# Patient Record
Sex: Female | Born: 1935 | Race: White | Hispanic: No | Marital: Single | Smoking: Never smoker
Health system: Southern US, Community
[De-identification: ages and names within clinical notes are randomized; demographics above are authoritative.]

## PROBLEM LIST (undated history)

## (undated) DIAGNOSIS — K219 Gastro-esophageal reflux disease without esophagitis: Secondary | ICD-10-CM

## (undated) DIAGNOSIS — T884XXA Failed or difficult intubation, initial encounter: Secondary | ICD-10-CM

## (undated) DIAGNOSIS — G4700 Insomnia, unspecified: Secondary | ICD-10-CM

## (undated) DIAGNOSIS — E785 Hyperlipidemia, unspecified: Secondary | ICD-10-CM

## (undated) DIAGNOSIS — L0211 Cutaneous abscess of neck: Secondary | ICD-10-CM

## (undated) DIAGNOSIS — F329 Major depressive disorder, single episode, unspecified: Secondary | ICD-10-CM

## (undated) DIAGNOSIS — M810 Age-related osteoporosis without current pathological fracture: Secondary | ICD-10-CM

## (undated) DIAGNOSIS — J384 Edema of larynx: Secondary | ICD-10-CM

## (undated) DIAGNOSIS — R069 Unspecified abnormalities of breathing: Secondary | ICD-10-CM

## (undated) DIAGNOSIS — Z930 Tracheostomy status: Secondary | ICD-10-CM

## (undated) DIAGNOSIS — E039 Hypothyroidism, unspecified: Secondary | ICD-10-CM

## (undated) DIAGNOSIS — F32A Depression, unspecified: Secondary | ICD-10-CM

## (undated) DIAGNOSIS — B950 Streptococcus, group A, as the cause of diseases classified elsewhere: Secondary | ICD-10-CM

## (undated) DIAGNOSIS — I10 Essential (primary) hypertension: Secondary | ICD-10-CM

## (undated) HISTORY — DX: Edema of larynx: J38.4

## (undated) HISTORY — DX: Gastro-esophageal reflux disease without esophagitis: K21.9

## (undated) HISTORY — DX: Cutaneous abscess of neck: L02.11

## (undated) HISTORY — DX: Age-related osteoporosis without current pathological fracture: M81.0

## (undated) HISTORY — DX: Streptococcus, group A, as the cause of diseases classified elsewhere: B95.0

## (undated) HISTORY — DX: Unspecified abnormalities of breathing: R06.9

## (undated) HISTORY — DX: Hyperlipidemia, unspecified: E78.5

## (undated) HISTORY — DX: Tracheostomy status: Z93.0

---

## 1998-05-06 ENCOUNTER — Ambulatory Visit (HOSPITAL_COMMUNITY): Admission: RE | Admit: 1998-05-06 | Discharge: 1998-05-06 | Payer: Self-pay | Admitting: Internal Medicine

## 1998-05-06 ENCOUNTER — Encounter: Payer: Self-pay | Admitting: Internal Medicine

## 1999-08-29 ENCOUNTER — Other Ambulatory Visit: Admission: RE | Admit: 1999-08-29 | Discharge: 1999-08-29 | Payer: Self-pay | Admitting: Emergency Medicine

## 1999-09-07 ENCOUNTER — Encounter: Payer: Self-pay | Admitting: Family Medicine

## 1999-09-07 ENCOUNTER — Ambulatory Visit (HOSPITAL_COMMUNITY): Admission: RE | Admit: 1999-09-07 | Discharge: 1999-09-07 | Payer: Self-pay | Admitting: Family Medicine

## 1999-09-26 ENCOUNTER — Encounter: Payer: Self-pay | Admitting: Internal Medicine

## 1999-10-09 ENCOUNTER — Encounter (INDEPENDENT_AMBULATORY_CARE_PROVIDER_SITE_OTHER): Payer: Self-pay | Admitting: *Deleted

## 1999-10-09 ENCOUNTER — Encounter (INDEPENDENT_AMBULATORY_CARE_PROVIDER_SITE_OTHER): Payer: Self-pay | Admitting: Specialist

## 1999-10-09 ENCOUNTER — Ambulatory Visit (HOSPITAL_COMMUNITY): Admission: RE | Admit: 1999-10-09 | Discharge: 1999-10-09 | Payer: Self-pay | Admitting: Gastroenterology

## 2000-09-11 ENCOUNTER — Encounter: Payer: Self-pay | Admitting: Family Medicine

## 2000-09-11 ENCOUNTER — Ambulatory Visit (HOSPITAL_COMMUNITY): Admission: RE | Admit: 2000-09-11 | Discharge: 2000-09-11 | Payer: Self-pay | Admitting: Family Medicine

## 2001-10-03 ENCOUNTER — Ambulatory Visit (HOSPITAL_COMMUNITY): Admission: RE | Admit: 2001-10-03 | Discharge: 2001-10-03 | Payer: Self-pay | Admitting: *Deleted

## 2001-12-10 ENCOUNTER — Other Ambulatory Visit: Admission: RE | Admit: 2001-12-10 | Discharge: 2001-12-10 | Payer: Self-pay | Admitting: Family Medicine

## 2003-01-06 ENCOUNTER — Ambulatory Visit (HOSPITAL_COMMUNITY): Admission: RE | Admit: 2003-01-06 | Discharge: 2003-01-06 | Payer: Self-pay | Admitting: Family Medicine

## 2003-01-12 ENCOUNTER — Encounter: Admission: RE | Admit: 2003-01-12 | Discharge: 2003-01-12 | Payer: Self-pay | Admitting: Family Medicine

## 2003-02-17 ENCOUNTER — Other Ambulatory Visit: Admission: RE | Admit: 2003-02-17 | Discharge: 2003-02-17 | Payer: Self-pay | Admitting: Family Medicine

## 2003-05-18 ENCOUNTER — Encounter: Admission: RE | Admit: 2003-05-18 | Discharge: 2003-08-16 | Payer: Self-pay | Admitting: Family Medicine

## 2004-02-18 ENCOUNTER — Other Ambulatory Visit: Admission: RE | Admit: 2004-02-18 | Discharge: 2004-02-18 | Payer: Self-pay | Admitting: Family Medicine

## 2004-04-07 ENCOUNTER — Ambulatory Visit: Payer: Self-pay | Admitting: Internal Medicine

## 2004-05-18 ENCOUNTER — Ambulatory Visit: Payer: Self-pay | Admitting: Internal Medicine

## 2005-09-28 ENCOUNTER — Other Ambulatory Visit: Admission: RE | Admit: 2005-09-28 | Discharge: 2005-09-28 | Payer: Self-pay | Admitting: Obstetrics and Gynecology

## 2005-12-04 ENCOUNTER — Ambulatory Visit (HOSPITAL_COMMUNITY): Admission: RE | Admit: 2005-12-04 | Discharge: 2005-12-04 | Payer: Self-pay | Admitting: Family Medicine

## 2007-01-08 ENCOUNTER — Ambulatory Visit (HOSPITAL_COMMUNITY): Admission: RE | Admit: 2007-01-08 | Discharge: 2007-01-08 | Payer: Self-pay | Admitting: Family Medicine

## 2007-02-06 ENCOUNTER — Other Ambulatory Visit: Admission: RE | Admit: 2007-02-06 | Discharge: 2007-02-06 | Payer: Self-pay | Admitting: Obstetrics and Gynecology

## 2008-01-22 ENCOUNTER — Ambulatory Visit (HOSPITAL_COMMUNITY): Admission: RE | Admit: 2008-01-22 | Discharge: 2008-01-22 | Payer: Self-pay | Admitting: Family Medicine

## 2009-03-08 ENCOUNTER — Other Ambulatory Visit: Admission: RE | Admit: 2009-03-08 | Discharge: 2009-03-08 | Payer: Self-pay | Admitting: Family Medicine

## 2009-03-17 ENCOUNTER — Ambulatory Visit (HOSPITAL_COMMUNITY): Admission: RE | Admit: 2009-03-17 | Discharge: 2009-03-17 | Payer: Self-pay | Admitting: Family Medicine

## 2009-09-09 ENCOUNTER — Encounter (INDEPENDENT_AMBULATORY_CARE_PROVIDER_SITE_OTHER): Payer: Self-pay | Admitting: *Deleted

## 2009-10-08 ENCOUNTER — Encounter: Payer: Self-pay | Admitting: Internal Medicine

## 2009-10-17 ENCOUNTER — Encounter (INDEPENDENT_AMBULATORY_CARE_PROVIDER_SITE_OTHER): Payer: Self-pay

## 2009-10-18 ENCOUNTER — Ambulatory Visit: Payer: Self-pay | Admitting: Internal Medicine

## 2009-10-27 ENCOUNTER — Ambulatory Visit: Payer: Self-pay | Admitting: Internal Medicine

## 2009-10-29 ENCOUNTER — Encounter: Payer: Self-pay | Admitting: Internal Medicine

## 2010-01-28 ENCOUNTER — Encounter: Payer: Self-pay | Admitting: Family Medicine

## 2010-01-29 ENCOUNTER — Encounter: Payer: Self-pay | Admitting: Family Medicine

## 2010-02-09 NOTE — Procedures (Signed)
Summary: Colonoscopy  Patient: Gabrielle Whitney Note: All result statuses are Final unless otherwise noted.  Tests: (1) Colonoscopy (COL)   COL Colonoscopy           DONE     [facility]     [address]     [place], [place]  [address]           COLONOSCOPY PROCEDURE REPORT           PATIENT:  [name], [name]  MR#:  [id number]     BIRTHDATE:  01-23-1933, 76 yrs. old  GENDER:  female     ENDOSCOPIST:  Hedwig Morton. Juanda Chance, MD     REF. BY:  Carolin Coy, M.D.     PROCEDURE DATE:  10/27/2009     PROCEDURE:  Colonoscopy 19147     ASA CLASS:  Class II     INDICATIONS:  Routine Risk Screening     MEDICATIONS:   Versed 10 mg, Fentanyl 100 mcg           DESCRIPTION OF PROCEDURE:   After the risks benefits and     alternatives of the procedure were thoroughly explained, informed     consent was obtained.  Digital rectal exam was performed and     revealed no rectal masses.   The LB PCF-H180AL [id number] endoscope     was introduced through the anus and advanced to the cecum, which     was identified by both the appendix and ileocecal valve, without     limitations.  The quality of the prep was good, using MiraLax.     The instrument was then slowly withdrawn as the colon was fully     examined.     <<PROCEDUREIMAGES>>           FINDINGS:  A sessile polyp was found. 5 mm polyp at 20cm, it was     located at the diverticulum Polyp was snared without cautery.     Retrieval was successful (see image7). snare polyp  Moderate     diverticulosis was found in the sigmoid colon (see image6, image1,     and image2).  This was otherwise a normal examination of the colon     (see image3, image4, image5, and image8).   Retroflexed views in     the rectum revealed no abnormalities.    The scope was then     withdrawn from the patient and the procedure completed.           COMPLICATIONS:  None     ENDOSCOPIC IMPRESSION:     1) Sessile polyp     2) Moderate diverticulosis in the sigmoid colon  3) Otherwise normal examination     RECOMMENDATIONS:     1) Await pathology results     2) High fiber diet.     REPEAT EXAM:  In 5 - 7 year(s) for.  recall will be determined     based on polyp pathology           ______________________________     Hedwig Morton. Juanda Chance, MD           CC:           n.     eSIGNED:   Hedwig Morton. Brodie at 10/27/2009 11:48 AM           Jeananne Rama, [phone]  Note: An exclamation mark (!) indicates a result that was not dispersed into the flowsheet. Document Creation Date: 10/27/2009  11:49 AM _______________________________________________________________________  (1) Order result status: Final Collection or observation date-time: 10/27/2009 11:37 Requested date-time:  Receipt date-time:  Reported date-time:  Referring Physician:   Ordering Physician: Lina Sar [id number]) Specimen Source:  Source: Launa Grill Order Number: [id number] Lab site:   Appended Document: Colonoscopy     Procedures Next Due Date:    Colonoscopy: 10/2016

## 2010-02-09 NOTE — Miscellaneous (Signed)
Summary: Lec previsit  Clinical Lists Changes  Medications: Added new medication of MIRALAX   POWD (POLYETHYLENE GLYCOL 3350) As per prep  instructions. - Signed Added new medication of REGLAN 10 MG  TABS (METOCLOPRAMIDE HCL) As per prep instructions. - Signed Added new medication of DULCOLAX 5 MG  TBEC (BISACODYL) Day before procedure take 2 at 3pm and 2 at 8pm. - Signed Rx of MIRALAX   POWD (POLYETHYLENE GLYCOL 3350) As per prep  instructions.;  #255gm x 0;  Signed;  Entered by: Ulis Rias RN;  Authorized by: Hart Carwin MD;  Method used: Electronically to [facility] [phone]*, [address] [place], [place], [place]  [address], Ph: [phone] or [phone], Fax: [phone] Rx of REGLAN 10 MG  TABS (METOCLOPRAMIDE HCL) As per prep instructions.;  #2 x 0;  Signed;  Entered by: Ulis Rias RN;  Authorized by: Hart Carwin MD;  Method used: Electronically to [facility] [phone]*, [address] [place], [place], [place]  [address], Ph: [phone] or [phone], Fax: [phone] Rx of DULCOLAX 5 MG  TBEC (BISACODYL) Day before procedure take 2 at 3pm and 2 at 8pm.;  #4 x 0;  Signed;  Entered by: Ulis Rias RN;  Authorized by: Hart Carwin MD;  Method used: Electronically to [facility] [phone]*, [address] [place], [place], [place]  [address], Ph: [phone] or [phone], Fax: [phone] Observations: Added new observation of NKA: T (10/18/2009 7:55)    Prescriptions: DULCOLAX 5 MG  TBEC (BISACODYL) Day before procedure take 2 at 3pm and 2 at 8pm.  #4 x 0   Entered by:   Ulis Rias RN   Authorized by:   Hart Carwin MD   Signed by:   Ulis Rias RN on 10/18/2009   Method used:   Electronically to        [facility] [phone]* (retail)       [address]       [place], [place]  [address]       Ph: [phone] or [phone]       Fax: [phone]   RxID:   [id number] REGLAN 10 MG  TABS (METOCLOPRAMIDE  HCL) As per prep instructions.  #2 x 0   Entered by:   Ulis Rias RN   Authorized by:   Hart Carwin MD   Signed by:   Ulis Rias RN on 10/18/2009   Method used:   Electronically to        [facility] [phone]* (retail)       [address]       [place], [place]  [address]       Ph: [phone] or [phone]       Fax: [phone]   RxID:   [id number] MIRALAX   POWD (POLYETHYLENE GLYCOL 3350) As per prep  instructions.  #255gm x 0   Entered by:   Ulis Rias RN   Authorized by:   Hart Carwin MD   Signed by:   Ulis Rias RN on 10/18/2009   Method used:   Electronically to        [facility] [phone]* (retail)       [address]       [place], [place]  [address]       Ph: [phone] or [phone]  Fax: [phone]   RxID:   [id number]

## 2010-02-09 NOTE — Op Note (Signed)
Summary: COLON (Dr Madilyn Fireman)                         [facility]  Patient:    Gabrielle Whitney, Gabrielle Whitney                        MRN: [id number] Proc. Date: 10/09/99 Adm. Date:  [id number] Attending:  Louie Bun CC:         Carola J. Gerri Spore, M.D.   Procedure Report  PROCEDURE PERFORMED:  Colonoscopy.  ENDOSCOPIST:  Everardo All. Madilyn Fireman, M.D.  INDICATIONS:  Change in stool caliber in a 77 year old patient with no prior colon screening.  She has had a few attempted sigmoidoscopies which apparently were of limited visualization due to sharp angulation at the rectosigmoid junction.  DESCRIPTION OF PROCEDURE:  The patient was placed in the left lateral decubitus position and placed on the pulse monitor with continuous low flow oxygen delivered by nasal cannula.  She was sedated with 70 mg IV Demerol and 7 mg IV Versed.  The Olympus video colonoscope was inserted into the rectum and advanced to the cecum, confirmed by transillumination at McBurneys point and visualization at the ileocecal valve and appendiceal orifice.  The prep was excellent.  The terminal ileum was intubated and explored for several cm and appeared to be within normal limits.  There were some streaks of erythema and friability, not circumferential in approximately 5-6 cm length of the ascending colon.  This appeared more consistent with scope trauma than a true colitis, but was visualized before the scope had entered that area.  Biopsies were taken.  The remainder of the ascending, transverse, descending, sigmoid and rectum all appeared normal with no further inflammatory changes, masses, polyps, diverticula or other mucosal abnormalities.  The scope was then withdrawn and the patient returned to the recovery room in stable condition. She tolerated the procedure well and there were no immediate complications.  IMPRESSION: Questionable right-sided colitis versus scope trauma versus artifact from bowel prep.  PLAN:   Await biopsy results, otherwise will treat with high fiber diet with possible fiber supplementation. DD:  10/09/99 TD:  10/09/99 Job: [id number]/[id number]    FINAL DIAGNOSIS    ***MICROSCOPIC EXAMINATION AND DIAGNOSIS***    COLON, ASCENDING, BIOPSY: NO ACTIVE COLITIS IDENTIFIED. FOCI   WITH PIGMENTED MACROPHAGES IN LAMINA PROPRIA CONSISTENT WITH   MELANOSIS COLI.    kv   Date Reported: 10/10/99 Lyn Hollingshead. Delila Spence, MD   *** Electronically Signed Out By EAA ***    Clinical information   Colitis vs. scope trauma. (tmc)    specimen(s) obtained   Colon, biopsy, ascending    Gross Description   Received in formalin are tan, soft tissue fragments that are   submitted in toto. Number: 2   Size: each 0.2 cm (GRP:smr 10/1)    smr/

## 2010-02-09 NOTE — Letter (Signed)
Summary: [facility] Instructions  [facility]  [address] [place], [place] [address]   Phone: [phone]  Fax: [phone]       Gabrielle Whitney    03/05/1933    MRN: [id number]       Procedure Day /Date: Thursday 10-27-09     Arrival Time:  9:30 a.m.     Procedure Time: 10:30 a.m.     Location of Procedure:                      [facility] (4th Floor)   PREPARATION FOR COLONOSCOPY WITH MIRALAX  Starting 5 days prior to your procedure  10-22-09 do not eat nuts, seeds, popcorn, corn, beans, peas,  salads, or any raw vegetables.  Do not take any fiber supplements (e.g. Metamucil, Citrucel, and Benefiber). ____________________________________________________________________________________________________   THE DAY BEFORE YOUR PROCEDURE         DATE: 10-26-09  DAY: Wednesday  1   Drink clear liquids the entire day-NO SOLID FOOD  2   Do not drink anything colored red or purple.  Avoid juices with pulp.  No orange juice.  3   Drink at least 64 oz. (8 glasses) of fluid/clear liquids during the day to prevent dehydration and help the prep work efficiently.  CLEAR LIQUIDS INCLUDE: Water Jello Ice Popsicles Tea (sugar ok, no milk/cream) Powdered fruit flavored drinks Coffee (sugar ok, no milk/cream) Gatorade Juice: apple, white grape, white cranberry  Lemonade Clear bullion, consomm, broth Carbonated beverages (any kind) Strained chicken noodle soup Hard Candy  4   Mix the entire bottle of Miralax with 64 oz. of Gatorade/Powerade in the morning and put in the refrigerator to chill.  5   At 3:00 pm take 2 Dulcolax/Bisacodyl tablets.  6   At 4:30 pm take one Reglan/Metoclopramide tablet.  7  Starting at 5:00 pm drink one 8 oz glass of the Miralax mixture every 15-20 minutes until you have finished drinking the entire 64 oz.  You should finish drinking prep around 7:30 or 8:00 pm.  8   If you are nauseated, you may take the 2nd  Reglan/Metoclopramide tablet at 6:30 pm.        9    At 8:00 pm take 2 more DULCOLAX/Bisacodyl tablets.     THE DAY OF YOUR PROCEDURE      DATE:   10-27-09  DAY: Thursday  You may drink clear liquids until  8:30 a.m.(HOURS BEFORE PROCEDURE).   MEDICATION INSTRUCTIONS  Unless otherwise instructed, you should take regular prescription medications with a small sip of water as early as possible the morning of your procedure.  Additional medication instructions: Do not take HCTZ am of procedure.         OTHER INSTRUCTIONS  You will need a responsible adult at least 75 years of age to accompany you and drive you home.   This person must remain in the waiting room during your procedure.  Wear loose fitting clothing that is easily removed.  Leave jewelry and other valuables at home.  However, you may wish to bring a book to read or an iPod/MP3 player to listen to music as you wait for your procedure to start.  Remove all body piercing jewelry and leave at home.  Total time from sign-in until discharge is approximately 2-3 hours.  You should go home directly after your procedure and rest.  You can resume normal activities the day after your procedure.  The day  of your procedure you should not:   Drive   Make legal decisions   Operate machinery   Drink alcohol   Return to work  You will receive specific instructions about eating, activities and medications before you leave.   The above instructions have been reviewed and explained to me by   Ulis Rias RN  October 18, 2009 8:16 AM     I fully understand and can verbalize these instructions _____________________________ Date _______

## 2010-02-09 NOTE — Letter (Signed)
Summary: [facility]  [facility]   Imported By: Sherian Rein 10/21/2009 11:10:58  _____________________________________________________________________  External Attachment:    Type:   Image     Comment:   External Document

## 2010-02-09 NOTE — Letter (Signed)
Summary: Pre Visit Letter Revised  [facility]  [address] [place], [place] [address]   Phone: [phone]  Fax: [phone]        09/09/2009 MRN: [id number] [facility] [address] [place], [place]  [address]             Procedure Date:  10-27-09   Welcome to the Gastroenterology Division at [facility].    You are scheduled to see a nurse for your pre-procedure visit on 10-13-09 at 9:30a.m. on the 3rd floor at [facility], [address]  We ask that you try to arrive at our office 15 minutes prior to your appointment time to allow for check-in.  Please take a minute to review the attached form.  If you answer "Yes" to one or more of the questions on the first page, we ask that you call the person listed at your earliest opportunity.  If you answer "No" to all of the questions, please complete the rest of the form and bring it to your appointment.    Your nurse visit will consist of discussing your medical and surgical history, your immediate family medical history, and your medications.   If you are unable to list all of your medications on the form, please bring the medication bottles to your appointment and we will list them.  We will need to be aware of both prescribed and over the counter drugs.  We will need to know exact dosage information as well.    Please be prepared to read and sign documents such as consent forms, a financial agreement, and acknowledgement forms.  If necessary, and with your consent, a friend or relative is welcome to sit-in on the nurse visit with you.  Please bring your insurance card so that we may make a copy of it.  If your insurance requires a referral to see a specialist, please bring your referral form from your primary care physician.  No co-pay is required for this nurse visit.     If you cannot keep your appointment, please call [phone] to cancel or reschedule prior to your appointment date.  This allows  [place] the opportunity to schedule an appointment for another patient in need of care.    Thank you for choosing [facility] for your medical needs.  We appreciate the opportunity to care for you.  Please visit [place] at our website  to learn more about our practice.  Sincerely, The Gastroenterology Division

## 2010-02-09 NOTE — Letter (Signed)
Summary: Patient Notice- Polyp Results  [facility]  [address] [place], [place] [address]   Phone: [phone]  Fax: [phone]        October 29, 2009 MRN: [id number]    [facility] [address] [place], [place]  [address]    Dear Ms. Redondo,  I am pleased to inform you that the colon polyp(s) removed during your recent colonoscopy was (were) found to be benign (no cancer detected) upon pathologic examination.The polyp consisted of a normal tissue ( not precancerous)  I recommend you have a repeat colonoscopy examination in 7_ years to look for recurrent polyps, as having colon polyps increases your risk for having recurrent polyps or even colon cancer in the future.  Should you develop new or worsening symptoms of abdominal pain, bowel habit changes or bleeding from the rectum or bowels, please schedule an evaluation with either your primary care physician or with me.  Additional information/recommendations:  _x_ No further action with gastroenterology is needed at this time. Please      follow-up with your primary care physician for your other healthcare      needs.  __ Please call [phone] to schedule a return visit to review your      situation.  __ Please keep your follow-up visit as already scheduled.  __ Continue treatment plan as outlined the day of your exam.  Please call us if you are having persistent problems or have questions about your condition that have not been fully answered at this time.  Sincerely,  Hart Carwin MD  This letter has been electronically signed by your physician.  Appended Document: Patient Notice- Polyp Results letter mailed

## 2010-05-26 NOTE — Procedures (Signed)
[facility]  Patient:    Gabrielle Whitney, Gabrielle Whitney                        MRN: [id number] Proc. Date: 10/09/99 Adm. Date:  [id number] Attending:  Louie Bun CC:         Carola J. Gerri Spore, M.D.   Procedure Report  PROCEDURE PERFORMED:  Colonoscopy.  ENDOSCOPIST:  Everardo All. Madilyn Fireman, M.D.  INDICATIONS:  Change in stool caliber in a 77 year old patient with no prior colon screening.  She has had a few attempted sigmoidoscopies which apparently were of limited visualization due to sharp angulation at the rectosigmoid junction.  DESCRIPTION OF PROCEDURE:  The patient was placed in the left lateral decubitus position and placed on the pulse monitor with continuous low flow oxygen delivered by nasal cannula.  She was sedated with 70 mg IV Demerol and 7 mg IV Versed.  The Olympus video colonoscope was inserted into the rectum and advanced to the cecum, confirmed by transillumination at McBurneys point and visualization at the ileocecal valve and appendiceal orifice.  The prep was excellent.  The terminal ileum was intubated and explored for several cm and appeared to be within normal limits.  There were some streaks of erythema and friability, not circumferential in approximately 5-6 cm length of the ascending colon.  This appeared more consistent with scope trauma than a true colitis, but was visualized before the scope had entered that area.  Biopsies were taken.  The remainder of the ascending, transverse, descending, sigmoid and rectum all appeared normal with no further inflammatory changes, masses, polyps, diverticula or other mucosal abnormalities.  The scope was then withdrawn and the patient returned to the recovery room in stable condition. She tolerated the procedure well and there were no immediate complications.  IMPRESSION: Questionable right-sided colitis versus scope trauma versus artifact from bowel prep.  PLAN:  Await biopsy results, otherwise  will treat with high fiber diet with possible fiber supplementation. DD:  10/09/99 TD:  10/09/99 Job: [id number]

## 2011-06-27 ENCOUNTER — Other Ambulatory Visit (HOSPITAL_COMMUNITY)
Admission: RE | Admit: 2011-06-27 | Discharge: 2011-06-27 | Disposition: A | Discharge status: Home / Self Care | Payer: Medicare Other | Source: Ambulatory Visit | Attending: Family Medicine | Admitting: Family Medicine

## 2011-06-27 ENCOUNTER — Other Ambulatory Visit: Payer: Self-pay | Admitting: Family Medicine

## 2011-06-27 DIAGNOSIS — Z124 Encounter for screening for malignant neoplasm of cervix: Secondary | ICD-10-CM | POA: Insufficient documentation

## 2011-06-28 ENCOUNTER — Other Ambulatory Visit (HOSPITAL_COMMUNITY): Payer: Self-pay | Admitting: Family Medicine

## 2011-06-28 DIAGNOSIS — Z1231 Encounter for screening mammogram for malignant neoplasm of breast: Secondary | ICD-10-CM

## 2011-07-25 ENCOUNTER — Ambulatory Visit (HOSPITAL_COMMUNITY)
Admission: RE | Admit: 2011-07-25 | Discharge: 2011-07-25 | Disposition: A | Discharge status: Home / Self Care | Payer: Medicare Other | Source: Ambulatory Visit | Attending: Family Medicine | Admitting: Family Medicine

## 2011-07-25 DIAGNOSIS — Z1231 Encounter for screening mammogram for malignant neoplasm of breast: Secondary | ICD-10-CM | POA: Insufficient documentation

## 2012-08-22 ENCOUNTER — Other Ambulatory Visit: Payer: Self-pay | Admitting: Family Medicine

## 2012-08-22 ENCOUNTER — Ambulatory Visit
Admission: RE | Admit: 2012-08-22 | Discharge: 2012-08-22 | Disposition: A | Discharge status: Home / Self Care | Payer: Medicare Other | Source: Ambulatory Visit | Attending: Family Medicine | Admitting: Family Medicine

## 2012-08-22 DIAGNOSIS — M25511 Pain in right shoulder: Secondary | ICD-10-CM

## 2012-11-06 ENCOUNTER — Other Ambulatory Visit: Payer: Self-pay | Admitting: Family Medicine

## 2012-11-06 DIAGNOSIS — Z1231 Encounter for screening mammogram for malignant neoplasm of breast: Secondary | ICD-10-CM

## 2012-12-10 ENCOUNTER — Ambulatory Visit
Admission: RE | Admit: 2012-12-10 | Discharge: 2012-12-10 | Disposition: A | Discharge status: Home / Self Care | Payer: Medicare Other | Source: Ambulatory Visit | Attending: Family Medicine | Admitting: Family Medicine

## 2012-12-10 DIAGNOSIS — Z1231 Encounter for screening mammogram for malignant neoplasm of breast: Secondary | ICD-10-CM

## 2013-10-05 ENCOUNTER — Other Ambulatory Visit: Payer: Self-pay | Admitting: Physician Assistant

## 2013-10-05 ENCOUNTER — Other Ambulatory Visit (HOSPITAL_COMMUNITY)
Admission: RE | Admit: 2013-10-05 | Discharge: 2013-10-05 | Disposition: A | Discharge status: Home / Self Care | Payer: Medicare Other | Source: Ambulatory Visit | Attending: Family Medicine | Admitting: Family Medicine

## 2013-10-05 DIAGNOSIS — Z8541 Personal history of malignant neoplasm of cervix uteri: Secondary | ICD-10-CM | POA: Diagnosis present

## 2013-10-07 LAB — CYTOLOGY - PAP

## 2014-02-08 DIAGNOSIS — G4700 Insomnia, unspecified: Secondary | ICD-10-CM | POA: Diagnosis not present

## 2014-03-04 DIAGNOSIS — R0683 Snoring: Secondary | ICD-10-CM | POA: Diagnosis not present

## 2014-03-04 DIAGNOSIS — G4700 Insomnia, unspecified: Secondary | ICD-10-CM | POA: Diagnosis not present

## 2014-03-18 DIAGNOSIS — R0982 Postnasal drip: Secondary | ICD-10-CM | POA: Diagnosis not present

## 2014-03-18 DIAGNOSIS — J309 Allergic rhinitis, unspecified: Secondary | ICD-10-CM | POA: Diagnosis not present

## 2014-03-20 ENCOUNTER — Inpatient Hospital Stay (HOSPITAL_COMMUNITY): Payer: Commercial Managed Care - HMO | Admitting: Anesthesiology

## 2014-03-20 ENCOUNTER — Inpatient Hospital Stay (HOSPITAL_COMMUNITY)
Admission: EM | Admit: 2014-03-20 | Discharge: 2014-03-25 | DRG: 011 | Disposition: A | Discharge status: Home / Self Care | Payer: Commercial Managed Care - HMO | Attending: Internal Medicine | Admitting: Internal Medicine

## 2014-03-20 ENCOUNTER — Emergency Department (HOSPITAL_COMMUNITY): Payer: Commercial Managed Care - HMO

## 2014-03-20 ENCOUNTER — Encounter (HOSPITAL_COMMUNITY)
Admission: EM | Disposition: A | Discharge status: Home / Self Care | Payer: Commercial Managed Care - HMO | Source: Home / Self Care | Attending: Pulmonary Disease

## 2014-03-20 DIAGNOSIS — R221 Localized swelling, mass and lump, neck: Secondary | ICD-10-CM | POA: Diagnosis not present

## 2014-03-20 DIAGNOSIS — L0291 Cutaneous abscess, unspecified: Secondary | ICD-10-CM

## 2014-03-20 DIAGNOSIS — J384 Edema of larynx: Secondary | ICD-10-CM | POA: Diagnosis present

## 2014-03-20 DIAGNOSIS — Z930 Tracheostomy status: Secondary | ICD-10-CM

## 2014-03-20 DIAGNOSIS — E871 Hypo-osmolality and hyponatremia: Secondary | ICD-10-CM | POA: Diagnosis present

## 2014-03-20 DIAGNOSIS — R0989 Other specified symptoms and signs involving the circulatory and respiratory systems: Secondary | ICD-10-CM | POA: Diagnosis present

## 2014-03-20 DIAGNOSIS — B950 Streptococcus, group A, as the cause of diseases classified elsewhere: Secondary | ICD-10-CM | POA: Diagnosis present

## 2014-03-20 DIAGNOSIS — J989 Respiratory disorder, unspecified: Secondary | ICD-10-CM | POA: Diagnosis not present

## 2014-03-20 DIAGNOSIS — E039 Hypothyroidism, unspecified: Secondary | ICD-10-CM | POA: Diagnosis not present

## 2014-03-20 DIAGNOSIS — L0211 Cutaneous abscess of neck: Principal | ICD-10-CM | POA: Diagnosis present

## 2014-03-20 DIAGNOSIS — E876 Hypokalemia: Secondary | ICD-10-CM | POA: Diagnosis not present

## 2014-03-20 DIAGNOSIS — F329 Major depressive disorder, single episode, unspecified: Secondary | ICD-10-CM | POA: Diagnosis present

## 2014-03-20 DIAGNOSIS — Z79899 Other long term (current) drug therapy: Secondary | ICD-10-CM | POA: Diagnosis not present

## 2014-03-20 DIAGNOSIS — Z888 Allergy status to other drugs, medicaments and biological substances status: Secondary | ICD-10-CM | POA: Diagnosis not present

## 2014-03-20 DIAGNOSIS — J9600 Acute respiratory failure, unspecified whether with hypoxia or hypercapnia: Secondary | ICD-10-CM | POA: Diagnosis not present

## 2014-03-20 DIAGNOSIS — F32A Depression, unspecified: Secondary | ICD-10-CM | POA: Diagnosis present

## 2014-03-20 DIAGNOSIS — E869 Volume depletion, unspecified: Secondary | ICD-10-CM | POA: Diagnosis not present

## 2014-03-20 DIAGNOSIS — R069 Unspecified abnormalities of breathing: Secondary | ICD-10-CM | POA: Diagnosis not present

## 2014-03-20 DIAGNOSIS — I10 Essential (primary) hypertension: Secondary | ICD-10-CM | POA: Diagnosis not present

## 2014-03-20 DIAGNOSIS — R1310 Dysphagia, unspecified: Secondary | ICD-10-CM | POA: Diagnosis present

## 2014-03-20 DIAGNOSIS — J9601 Acute respiratory failure with hypoxia: Secondary | ICD-10-CM | POA: Diagnosis present

## 2014-03-20 DIAGNOSIS — R739 Hyperglycemia, unspecified: Secondary | ICD-10-CM | POA: Diagnosis present

## 2014-03-20 DIAGNOSIS — R4702 Dysphasia: Secondary | ICD-10-CM | POA: Diagnosis present

## 2014-03-20 DIAGNOSIS — J398 Other specified diseases of upper respiratory tract: Secondary | ICD-10-CM | POA: Diagnosis not present

## 2014-03-20 DIAGNOSIS — J390 Retropharyngeal and parapharyngeal abscess: Secondary | ICD-10-CM | POA: Diagnosis not present

## 2014-03-20 DIAGNOSIS — K047 Periapical abscess without sinus: Secondary | ICD-10-CM

## 2014-03-20 DIAGNOSIS — T783XXA Angioneurotic edema, initial encounter: Secondary | ICD-10-CM | POA: Diagnosis not present

## 2014-03-20 HISTORY — PX: TRACHEOSTOMY TUBE PLACEMENT: SHX814

## 2014-03-20 HISTORY — DX: Depression, unspecified: F32.A

## 2014-03-20 HISTORY — DX: Major depressive disorder, single episode, unspecified: F32.9

## 2014-03-20 HISTORY — DX: Cutaneous abscess of neck: L02.11

## 2014-03-20 HISTORY — DX: Hypothyroidism, unspecified: E03.9

## 2014-03-20 HISTORY — DX: Failed or difficult intubation, initial encounter: T88.4XXA

## 2014-03-20 HISTORY — DX: Essential (primary) hypertension: I10

## 2014-03-20 LAB — BASIC METABOLIC PANEL
ANION GAP: 10 (ref 5–15)
BUN: 14 mg/dL (ref 6–23)
CALCIUM: 8.8 mg/dL (ref 8.4–10.5)
CHLORIDE: 100 mmol/L (ref 96–112)
CO2: 22 mmol/L (ref 19–32)
Creatinine, Ser: 0.64 mg/dL (ref 0.50–1.10)
GFR, EST NON AFRICAN AMERICAN: 83 mL/min — AB (ref >90)
GLUCOSE: 134 mg/dL — AB (ref 70–99)
POTASSIUM: 3.8 mmol/L (ref 3.5–5.1)
Sodium: 132 mmol/L — ABNORMAL LOW (ref 135–145)

## 2014-03-20 LAB — GLUCOSE, CAPILLARY
Glucose-Capillary: 230 mg/dL — ABNORMAL HIGH (ref 70–99)
Glucose-Capillary: 179 mg/dL — ABNORMAL HIGH (ref 70–99)

## 2014-03-20 LAB — CBC
HCT: 41.6 % (ref 36.0–46.0)
HEMATOCRIT: 41.7 % (ref 36.0–46.0)
Hemoglobin: 14.5 g/dL (ref 12.0–15.0)
Hemoglobin: 14.2 g/dL (ref 12.0–15.0)
MCH: 32 pg (ref 26.0–34.0)
MCH: 31.3 pg (ref 26.0–34.0)
MCHC: 34.9 g/dL (ref 30.0–36.0)
MCHC: 34.1 g/dL (ref 30.0–36.0)
MCV: 91.8 fL (ref 78.0–100.0)
MCV: 91.9 fL (ref 78.0–100.0)
PLATELETS: 204 10*3/uL (ref 150–400)
PLATELETS: 225 10*3/uL (ref 150–400)
RBC: 4.53 MIL/uL (ref 3.87–5.11)
RBC: 4.54 MIL/uL (ref 3.87–5.11)
RDW: 12.5 % (ref 11.5–15.5)
RDW: 12.6 % (ref 11.5–15.5)
WBC: 12.5 10*3/uL — ABNORMAL HIGH (ref 4.0–10.5)
WBC: 14.1 10*3/uL — ABNORMAL HIGH (ref 4.0–10.5)

## 2014-03-20 LAB — CREATININE, SERUM
CREATININE: 0.71 mg/dL (ref 0.50–1.10)
GFR calc Af Amer: >90 mL/min (ref >90)
GFR calc non Af Amer: 80 mL/min — ABNORMAL LOW (ref >90)

## 2014-03-20 LAB — MRSA PCR SCREENING: MRSA by PCR: NEGATIVE

## 2014-03-20 SURGERY — CREATION, TRACHEOSTOMY
Anesthesia: General | Site: Neck

## 2014-03-20 MED ORDER — 0.9 % SODIUM CHLORIDE (POUR BTL) OPTIME
TOPICAL | Status: DC | PRN
  Administered 2014-03-20: 1000 mL

## 2014-03-20 MED ORDER — PROPOFOL 10 MG/ML IV BOLUS
INTRAVENOUS | Status: AC
  Filled 2014-03-20: qty 1

## 2014-03-20 MED ORDER — LIDOCAINE HCL (PF) 1 % IJ SOLN
INTRAMUSCULAR | Status: AC
  Filled 2014-03-20: qty 5

## 2014-03-20 MED ORDER — SODIUM CHLORIDE 0.9 % IV SOLN
1.5000 g | Freq: Once | INTRAVENOUS | Status: AC
  Administered 2014-03-20: 1.5 g via INTRAVENOUS
  Filled 2014-03-20: qty 1.5

## 2014-03-20 MED ORDER — LIDOCAINE HCL (PF) 1 % IJ SOLN
5.0000 mL | Freq: Once | INTRAMUSCULAR | Status: AC
  Administered 2014-03-20: 5 mL
  Filled 2014-03-20: qty 5

## 2014-03-20 MED ORDER — FENTANYL CITRATE 0.05 MG/ML IJ SOLN
INTRAMUSCULAR | Status: AC
  Administered 2014-03-20: 50 ug
  Administered 2014-03-20: 50 ug via INTRAVENOUS
  Filled 2014-03-20: qty 2

## 2014-03-20 MED ORDER — LIDOCAINE VISCOUS 2 % MT SOLN
15.0000 mL | Freq: Once | OROMUCOSAL | Status: DC
  Filled 2014-03-20: qty 15

## 2014-03-20 MED ORDER — SUCCINYLCHOLINE CHLORIDE 20 MG/ML IJ SOLN
INTRAMUSCULAR | Status: AC
  Filled 2014-03-20: qty 1

## 2014-03-20 MED ORDER — FENTANYL CITRATE 0.05 MG/ML IJ SOLN
INTRAMUSCULAR | Status: AC
  Filled 2014-03-20: qty 5

## 2014-03-20 MED ORDER — LEVOTHYROXINE SODIUM 100 MCG IV SOLR
50.0000 ug | Freq: Every day | INTRAVENOUS | Status: DC
  Administered 2014-03-20 – 2014-03-25 (×6): 50 ug via INTRAVENOUS
  Filled 2014-03-20 (×6): qty 5

## 2014-03-20 MED ORDER — LIDOCAINE HCL (CARDIAC) 20 MG/ML IV SOLN
INTRAVENOUS | Status: AC
  Filled 2014-03-20: qty 5

## 2014-03-20 MED ORDER — SODIUM CHLORIDE 0.45 % IV SOLN
INTRAVENOUS | Status: DC
  Administered 2014-03-20 – 2014-03-23 (×5): via INTRAVENOUS

## 2014-03-20 MED ORDER — CLINDAMYCIN PHOSPHATE 300 MG/50ML IV SOLN
300.0000 mg | Freq: Three times a day (TID) | INTRAVENOUS | Status: DC
  Administered 2014-03-20 – 2014-03-25 (×15): 300 mg via INTRAVENOUS
  Filled 2014-03-20 (×16): qty 50

## 2014-03-20 MED ORDER — PROPOFOL 10 MG/ML IV BOLUS
INTRAVENOUS | Status: DC | PRN
  Administered 2014-03-20: 50 mg via INTRAVENOUS

## 2014-03-20 MED ORDER — ROCURONIUM BROMIDE 100 MG/10ML IV SOLN
INTRAVENOUS | Status: AC
  Filled 2014-03-20: qty 1

## 2014-03-20 MED ORDER — ONDANSETRON HCL 4 MG/2ML IJ SOLN
INTRAMUSCULAR | Status: DC | PRN
  Administered 2014-03-20: 4 mg via INTRAVENOUS

## 2014-03-20 MED ORDER — SODIUM CHLORIDE 0.9 % IV SOLN
10.0000 mg | INTRAVENOUS | Status: DC | PRN
  Administered 2014-03-20: 50 ug/min via INTRAVENOUS

## 2014-03-20 MED ORDER — LIDOCAINE-EPINEPHRINE 1 %-1:100000 IJ SOLN
INTRAMUSCULAR | Status: DC | PRN
  Administered 2014-03-20: 2 mL
  Administered 2014-03-20: 7 mL

## 2014-03-20 MED ORDER — REMIFENTANIL HCL 1 MG IV SOLR
INTRAVENOUS | Status: DC | PRN
  Administered 2014-03-20: 25 ug via INTRAVENOUS

## 2014-03-20 MED ORDER — HEPARIN SODIUM (PORCINE) 5000 UNIT/ML IJ SOLN
5000.0000 [IU] | Freq: Three times a day (TID) | INTRAMUSCULAR | Status: DC
  Administered 2014-03-21 – 2014-03-25 (×13): 5000 [IU] via SUBCUTANEOUS
  Filled 2014-03-20 (×13): qty 1

## 2014-03-20 MED ORDER — HYDROMORPHONE HCL 1 MG/ML IJ SOLN
INTRAMUSCULAR | Status: AC
  Administered 2014-03-20: 1 mg
  Filled 2014-03-20: qty 1

## 2014-03-20 MED ORDER — IOHEXOL 300 MG/ML  SOLN
100.0000 mL | Freq: Once | INTRAMUSCULAR | Status: AC | PRN
  Administered 2014-03-20: 100 mL via INTRAVENOUS

## 2014-03-20 MED ORDER — CLINDAMYCIN PHOSPHATE 600 MG/50ML IV SOLN
600.0000 mg | Freq: Once | INTRAVENOUS | Status: AC
  Administered 2014-03-20: 600 mg via INTRAVENOUS
  Filled 2014-03-20: qty 50

## 2014-03-20 MED ORDER — DEXTROSE-NACL 5-0.45 % IV SOLN
INTRAVENOUS | Status: DC
  Administered 2014-03-20: 15:00:00 via INTRAVENOUS

## 2014-03-20 MED ORDER — ETOMIDATE 2 MG/ML IV SOLN
INTRAVENOUS | Status: AC
  Filled 2014-03-20: qty 20

## 2014-03-20 MED ORDER — ROCURONIUM BROMIDE 50 MG/5ML IV SOLN
INTRAVENOUS | Status: AC
  Filled 2014-03-20: qty 2

## 2014-03-20 MED ORDER — LACTATED RINGERS IV SOLN
INTRAVENOUS | Status: DC | PRN
  Administered 2014-03-20: 11:00:00 via INTRAVENOUS

## 2014-03-20 MED ORDER — PROPOFOL 10 MG/ML IV BOLUS
INTRAVENOUS | Status: AC
  Filled 2014-03-20: qty 20

## 2014-03-20 MED ORDER — KETAMINE HCL 10 MG/ML IJ SOLN
1.0000 mg/kg | Freq: Once | INTRAMUSCULAR | Status: DC
  Filled 2014-03-20: qty 7

## 2014-03-20 MED ORDER — REMIFENTANIL HCL 1 MG IV SOLR
INTRAVENOUS | Status: AC
  Filled 2014-03-20: qty 1000

## 2014-03-20 MED ORDER — ONDANSETRON HCL 4 MG/2ML IJ SOLN
INTRAMUSCULAR | Status: AC
  Filled 2014-03-20: qty 2

## 2014-03-20 MED ORDER — SODIUM CHLORIDE 0.9 % IJ SOLN
INTRAMUSCULAR | Status: AC
  Filled 2014-03-20: qty 10

## 2014-03-20 MED ORDER — LIDOCAINE-EPINEPHRINE (PF) 1 %-1:200000 IJ SOLN
INTRAMUSCULAR | Status: AC
  Filled 2014-03-20: qty 30

## 2014-03-20 MED ORDER — MIDAZOLAM HCL 2 MG/2ML IJ SOLN
INTRAMUSCULAR | Status: AC
  Filled 2014-03-20: qty 2

## 2014-03-20 MED ORDER — FENTANYL CITRATE 0.05 MG/ML IJ SOLN
25.0000 ug | Freq: Once | INTRAMUSCULAR | Status: AC
  Administered 2014-03-20: 25 ug via INTRAVENOUS
  Filled 2014-03-20: qty 2

## 2014-03-20 MED ORDER — FENTANYL CITRATE 0.05 MG/ML IJ SOLN: 12.5000 ug | INTRAMUSCULAR | Status: DC | PRN

## 2014-03-20 MED ORDER — MIDAZOLAM HCL 2 MG/2ML IJ SOLN
INTRAMUSCULAR | Status: AC
  Administered 2014-03-20: 2 mg via INTRAVENOUS
  Filled 2014-03-20: qty 4

## 2014-03-20 MED ORDER — DEXAMETHASONE SODIUM PHOSPHATE 10 MG/ML IJ SOLN
INTRAMUSCULAR | Status: DC | PRN
  Administered 2014-03-20: 20 mg via INTRAVENOUS

## 2014-03-20 MED ORDER — SODIUM CHLORIDE 0.9 % IV SOLN
1.5000 g | Freq: Four times a day (QID) | INTRAVENOUS | Status: DC
  Administered 2014-03-20 – 2014-03-25 (×20): 1.5 g via INTRAVENOUS
  Filled 2014-03-20 (×23): qty 1.5

## 2014-03-20 MED ORDER — DEXAMETHASONE SODIUM PHOSPHATE 10 MG/ML IJ SOLN
INTRAMUSCULAR | Status: AC
  Filled 2014-03-20: qty 1

## 2014-03-20 MED ORDER — CLINDAMYCIN PHOSPHATE 600 MG/50ML IV SOLN
INTRAVENOUS | Status: DC | PRN
  Administered 2014-03-20: 600 mg via INTRAVENOUS

## 2014-03-20 MED FILL — Medication: Qty: 1 | Status: AC

## 2014-03-20 SURGICAL SUPPLY — 37 items
BLADE HEX COATED 2.75 (ELECTRODE) ×3 IMPLANT
BLADE SURG 15 STRL LF DISP TIS (BLADE) ×1 IMPLANT
BLADE SURG 15 STRL SS (BLADE) ×6
BLADE SURG SZ11 CARB STEEL (BLADE) IMPLANT
DRAPE LAPAROTOMY T 98X78 PEDS (DRAPES) ×3 IMPLANT
DRAPE SHEET LG 3/4 BI-LAMINATE (DRAPES) ×2 IMPLANT
DRAPE SPLIT 77X100IN (DRAPES) ×2 IMPLANT
ELECT REM PT RETURN 9FT ADLT (ELECTROSURGICAL) ×3
ELECTRODE REM PT RTRN 9FT ADLT (ELECTROSURGICAL) ×1 IMPLANT
GAUZE SPONGE 4X4 12PLY STRL (GAUZE/BANDAGES/DRESSINGS) ×3 IMPLANT
GAUZE SPONGE 4X4 16PLY XRAY LF (GAUZE/BANDAGES/DRESSINGS) ×3 IMPLANT
GLOVE BIOGEL M 8.0 STRL (GLOVE) ×3 IMPLANT
GLOVE BIOGEL PI IND STRL 7.0 (GLOVE) ×1 IMPLANT
GLOVE BIOGEL PI INDICATOR 7.0 (GLOVE) ×2
GOWN SPEC L4 XLG W/TWL (GOWN DISPOSABLE) ×3 IMPLANT
GOWN STRL REUS W/TWL LRG LVL3 (GOWN DISPOSABLE) ×3 IMPLANT
GOWN STRL REUS W/TWL XL LVL3 (GOWN DISPOSABLE) ×9 IMPLANT
HEMOSTAT SURGICEL 4X8 (HEMOSTASIS) ×2 IMPLANT
HOLDER TRACH TUBE VELCRO 19.5 (MISCELLANEOUS) ×2 IMPLANT
KIT BASIN OR (CUSTOM PROCEDURE TRAY) ×3 IMPLANT
LIQUID BAND (GAUZE/BANDAGES/DRESSINGS) ×2 IMPLANT
NDL HYPO 25X1 1.5 SAFETY (NEEDLE) ×1 IMPLANT
NEEDLE HYPO 25X1 1.5 SAFETY (NEEDLE) ×3 IMPLANT
NS IRRIG 1000ML POUR BTL (IV SOLUTION) ×3 IMPLANT
PACK BASIC VI WITH GOWN DISP (CUSTOM PROCEDURE TRAY) ×3 IMPLANT
PENCIL BUTTON HOLSTER BLD 10FT (ELECTRODE) ×3 IMPLANT
SPONGE DRAIN TRACH 4X4 STRL 2S (GAUZE/BANDAGES/DRESSINGS) ×2 IMPLANT
SUT NYLON 3 0 (SUTURE) IMPLANT
SUT PROLENE 2 0 SH DA (SUTURE) ×4 IMPLANT
SUT PROLENE 4 0 PS 2 18 (SUTURE) ×2 IMPLANT
SUT SILK 3 0 (SUTURE)
SUT SILK 3-0 18XBRD TIE 12 (SUTURE) IMPLANT
SUT VICRYL 4-0 (SUTURE) ×2 IMPLANT
SYR CONTROL 10ML LL (SYRINGE) ×5 IMPLANT
SYRINGE 10CC LL (SYRINGE) ×4 IMPLANT
TUBE TRACH SHILEY 8 DIST CUF (TUBING) ×2 IMPLANT
YANKAUER SUCT BULB TIP 10FT TU (MISCELLANEOUS) ×3 IMPLANT

## 2014-03-20 NOTE — ED Provider Notes (Signed)
CSN: [id number]     Arrival date & time 03/20/14  7829 History   First MD Initiated Contact with Patient 03/20/14 [phone]     Chief Complaint  Patient presents with  . Lymphadenopathy     (Consider location/radiation/quality/duration/timing/severity/associated sxs/prior Treatment) Patient is a 81 y.o. female presenting with pharyngitis. The history is provided by the patient.  Sore Throat This is a new problem. The current episode started 2 days ago. The problem occurs constantly. The problem has been gradually worsening. Associated symptoms include shortness of breath. Pertinent negatives include no chest pain, no abdominal pain and no headaches. Nothing aggravates the symptoms. Nothing relieves the symptoms. She has tried nothing for the symptoms.    No past medical history on file. No past surgical history on file. No family history on file. History  Substance Use Topics  . Smoking status: Not on file  . Smokeless tobacco: Not on file  . Alcohol Use: Not on file   OB History    No data available     Review of Systems  Constitutional: Negative for fever and chills.  HENT: Positive for postnasal drip, trouble swallowing (mild) and voice change (hoarse). Negative for congestion, dental problem, ear discharge, ear pain, sinus pressure and tinnitus.   Respiratory: Positive for shortness of breath. Negative for cough.   Cardiovascular: Negative for chest pain.  Gastrointestinal: Negative for abdominal pain.  Neurological: Negative for headaches.  All other systems reviewed and are negative.     Allergies  Statins  Home Medications   Prior to Admission medications   Not on File   BP 169/74 mmHg  Pulse 107  Temp(Src) 98.4 F (36.9 C) (Oral)  Resp 18  SpO2 96% Physical Exam  Constitutional: She is oriented to person, place, and time. She appears well-developed and well-nourished. No distress.  HENT:  Head: Normocephalic and atraumatic.  Mouth/Throat:    Eyes: EOM  are normal. Pupils are equal, round, and reactive to light.  Neck: Normal range of motion. Neck supple. No JVD present. No tracheal deviation present.  Cardiovascular: Normal rate and regular rhythm.  Exam reveals no friction rub.   No murmur heard. Pulmonary/Chest: Effort normal and breath sounds normal. No stridor. No respiratory distress. She has no wheezes. She has no rales.  Abdominal: Soft. She exhibits no distension. There is no tenderness. There is no rebound.  Musculoskeletal: Normal range of motion. She exhibits no edema.  Lymphadenopathy:    She has cervical adenopathy (L submandibular).  Neurological: She is alert and oriented to person, place, and time.  Skin: No rash noted. She is not diaphoretic.  Nursing note and vitals reviewed.   ED Course  Procedures (including critical care time) Labs Review Labs Reviewed  CBC  BASIC METABOLIC PANEL    Imaging Review Ct Soft Tissue Neck W Contrast  03/20/2014   CLINICAL DATA:  Left-sided neck swelling. Tenderness in the left neck. Pain with swallowing.  EXAM: CT NECK WITH CONTRAST  TECHNIQUE: Multidetector CT imaging of the neck was performed using the standard protocol following the bolus administration of intravenous contrast.  CONTRAST:  OMNIPAQUE IOHEXOL 300 MG/ML  SOLN  COMPARISON:  None.  FINDINGS: Pharynx and larynx: Extensive mucosal and submucosal soft tissue swelling is present in the left neck from the level of the glossal tonsillar sulcus inferiorly to the piriform recess. A low-density peripherally enhancing collection at the level of the hyoid measures 14 x 9 x 7 mm, compatible with a developing abscess. The there  is asymmetric soft tissue swelling within the left vallecula and marked mass effect along the a left posterolateral oropharynx. The tongue base is otherwise unremarkable vocal cord scratch the there is some edema extending into the left false vocal cord. The true vocal cords are unremarkable and midline.   Inflammatory changes extend through the strap muscles on the left into the subcutaneous soft tissues.  Salivary glands: Ductal dilation and asymmetric enlargement is noted in the left submandibular gland. No obstructing stone is visible. The right submandibular gland and bilateral parotid glands are within normal limits.  Thyroid: Negative  Lymph nodes: Prominent jugulodigastric lymph nodes are evident bilaterally. No other significant adenopathy is present.  Vascular: Atherosclerotic calcifications are present in the the aorta at the origins the great vessels without significant stenosis. There is moderate tortuosity of the common carotid arteries bilaterally. Dense atherosclerotic calcifications are present at the carotid bifurcations bilaterally. There is no definite stenosis.  Limited intracranial: Within normal limits  Visualized orbits: Negative  Mastoids and visualized paranasal sinuses: The left sphenoid sinus is partially opacified. The visualized paranasal sinuses and the mastoid air cells are otherwise clear.  Skeleton: Degenerative changes are present throughout the cervical spine. There is fusion across the disc space at C3-4. Posterior elements are fused as well. Endplate degenerative change and facet disease is noted bilaterally at C4-5 with slight anterolisthesis. No focal lytic or blastic lesions are evident.  Upper chest: The lung apices are clear.  IMPRESSION: 1. Extensive mucosal and submucosal soft tissue swelling on the left from the glossal tonsillar sulcus through the piriform recess. 2. Low-density peripherally enhancing collection at the level the hyoid measures 14 x 9 x 7 mm, most compatible with a developing abscess. 3. This is most likely na inflammatory process although underlying neoplasm is also considered with significant mass effect in the left posterolateral oropharynx no significant adenopathy is present. 4. Asymmetric enlargement and peripheral stranding about the left  submandibular gland with dilation of the central ducts but no visible obstruction within the left submandibular duct outside of the gland. This may be a secondary effect of the inflammation. 5. Moderate atherosclerotic calcifications without a definite stenosis. 6. Multilevel spondylosis in the cervical spine.   Electronically Signed   By: Marin Robertshristopher  Mattern M.D.   On: 03/20/2014 09:27     EKG Interpretation None       CRITICAL CARE Performed by: Dagmar HaitWALDEN, WILLIAM Jermika Olden   Total critical care time: 30 minutes  Critical care time was exclusive of separately billable procedures and treating other patients.  Critical care was necessary to treat or prevent imminent or life-threatening deterioration.  Critical care was time spent personally by me on the following activities: development of treatment plan with patient and/or surrogate as well as nursing, discussions with consultants, evaluation of patient's response to treatment, examination of patient, obtaining history from patient or surrogate, ordering and performing treatments and interventions, ordering and review of laboratory studies, ordering and review of radiographic studies, pulse oximetry and re-evaluation of patient's condition.   MDM   Final diagnoses:  Throat swelling  Neck abscess  Acute respiratory failure with hypoxia    30F here with throat swelling. Began 2 days ago as right sided lymphadenopathy in the right submandibular area. She saw her doctor for this who gave her some nasal spray. She was at that time also having some sinus congestion and postnasal drip. The throat swelling has now shifted to the left side and has become worse. She is having some  pain with swallowing hoarseness of her voice. She states she's having some difficulty breathing. Here she is resting comfortably with unlabored respirations. She does have some visible left sided throat swelling. She has some swelling of her posterior oropharynx on the left  behind her tonsil. She has no uvular shift. I think this might be an early peritonsillar abscess. She has normal thyroid elevation with swallowing. Plan for CT of the neck with contrast. Will check basic labs. CT of neck shows abscess at hyoid bone. On re-exam, talking in complete sentences, occasional stridor, not persistent. States she's not feeling better but not getting any worse.  Patient acutely became short of breath about 5 minutes after getting the clindamycin. She became apneic and seizure. She was cyanotic. She was moved to the resuscitation bay and give supplemental oxygen through a BVM with great improvement of her sats. I attempted to intubate after giving 10 mg of etomidate. No paralytics given. Airway severely edematous. Cords visible and she had good air exchange, however I was unable to pass a Bougie through the cords.  Anesthesia was stat paged and graciously came to help. They were unable to pass a bougie also. Patient prior to anesthesia's attempt was moving, so she was given propofol.  Patient was taken to the OR for further management. She was maintaining her saturations with BVM, so there was no need for emergent cricothyrotomy at the bedside.   I have reviewed all labs and imaging and considered them in my medical decision making.   Elwin Mocha, MD 03/20/14 [phone]

## 2014-03-20 NOTE — H&P (Signed)
PULMONARY / CRITICAL CARE MEDICINE   Name: ARBOR LEER MRN: [id number] DOB: October 23, 1933    ADMISSION DATE:  03/20/2014 CONSULTATION DATE:  3/12  REFERRING MD :  Daphine Deutscher   CHIEF COMPLAINT:    INITIAL PRESENTATION:  81 year old female admitted to [facility] on 3/12 w/ large left deep neck abscess and upper airway obstruction. Went to OR for emergent I&D w/ trach for airway. PCCM asked to see post-op.   STUDIES:  CT neck: . Extensive mucosal and submucosal soft tissue swelling on the left from the glossal tonsillar sulcus through the piriform recess. Low-density peripherally enhancing collection at the level the hyoid measuAsymmetric enlargement and peripheral stranding about the left submandibular gland with dilation of the central ducts but no visible obstruction within the left submandibular duct outside of the gland. res 14 x 9 x 7 mm, most compatible with a developing Abscess.  SIGNIFICANT EVENTS:    HISTORY OF PRESENT ILLNESS:   81 y.o. female who presents to the [facility] emergency room on 3/12 complaining of dysphagia and swelling of her neck for the past 2-3 days. Her symptoms had progressively worsened. She has no previous history of ENT surgery. She also complains of occasional shortness of breath. Her Next CT Scan, She Was Noted to Have a 1-1/2 cm Abscess within the Left Neck at the Level of the Hyoid Bone. While she was in the emergency room, she acutely decompensated and became stridorous. Attempts to intubate the patient in the emergency room was unsuccessful. The patient was noted to have a severely erythematous larynx. The patient was taken to the operating room emergently to secure her airway. Due to her severe airway edema, the decision was made for the patient to undergo the tracheostomy tube placement and drainage of her deep neck abscess. She returned to the ICU for recovery. PCCM   PAST MEDICAL HISTORY :   has no past medical history on file.  has no past surgical history on  file. Prior to Admission medications   Medication Sig Start Date End Date Taking? Authorizing Provider  ALPRAZolam Prudy Feeler) 0.5 MG tablet Take 0.25-0.5 mg by mouth at bedtime as needed for anxiety.   Yes Historical Provider, MD  amLODipine (NORVASC) 5 MG tablet Take 5 mg by mouth every morning.   Yes Historical Provider, MD  levothyroxine (SYNTHROID, LEVOTHROID) 100 MCG tablet Take 100 mcg by mouth daily before breakfast.   Yes Historical Provider, MD  losartan (COZAAR) 50 MG tablet Take 50 mg by mouth daily.   Yes Historical Provider, MD  metoprolol (LOPRESSOR) 50 MG tablet Take 50 mg by mouth every morning.   Yes Historical Provider, MD  sertraline (ZOLOFT) 100 MG tablet Take 100 mg by mouth every morning.   Yes Historical Provider, MD   Allergies  Allergen Reactions  . Statins     Achy legs     FAMILY HISTORY:  has no family status information on file.  SOCIAL HISTORY:    REVIEW OF SYSTEMS:  Unable   SUBJECTIVE:  No distress  VITAL SIGNS: Temp:  [98.4 F (36.9 C)-99.4 F (37.4 C)] 99.4 F (37.4 C) (03/12 1550) Pulse Rate:  [101-120] 105 (03/12 1241) Resp:  [18-30] 20 (03/12 1241) BP: (167-180)/(69-81) 167/72 mmHg (03/12 1241) SpO2:  [92 %-100 %] 92 % (03/12 1241) HEMODYNAMICS:   VENTILATOR SETTINGS:   INTAKE / OUTPUT:  Intake/Output Summary (Last 24 hours) at 03/20/14 1637 Last data filed at 03/20/14 1449  Gross per 24 hour  Intake  500 ml  Output      2 ml  Net    498 ml    PHYSICAL EXAMINATION: General:  81 year old white female resting in bed s/p trach and I&D of neck abscess  Neuro:  Awake, alert, no focal def  HEENT:  # 6 cuffed trach w/ bloody drainage from stoma. Surgical incision site above trach unremarkable and well approximated.  Cardiovascular:  rrr Lungs:  Decreased on right, some scattered rhonchi no accessory muscle use  Abdomen:  + bowel sounds no OM  Musculoskeletal:  Intact  Skin:  Intact   LABS:  CBC  Recent Labs Lab  03/20/14 0746  WBC 12.5*  HGB 14.5  HCT 41.6  PLT 204   Coag's No results for input(s): APTT, INR in the last 168 hours. BMET  Recent Labs Lab 03/20/14 0746  NA 132*  K 3.8  CL 100  CO2 22  BUN 14  CREATININE 0.64  GLUCOSE 134*   Electrolytes  Recent Labs Lab 03/20/14 0746  CALCIUM 8.8   Sepsis Markers No results for input(s): LATICACIDVEN, PROCALCITON, O2SATVEN in the last 168 hours. ABG No results for input(s): PHART, PCO2ART, PO2ART in the last 168 hours. Liver Enzymes No results for input(s): AST, ALT, ALKPHOS, BILITOT, ALBUMIN in the last 168 hours. Cardiac Enzymes No results for input(s): TROPONINI, PROBNP in the last 168 hours. Glucose No results for input(s): GLUCAP in the last 168 hours.  Imaging No results found.   ASSESSMENT / PLAN:  PULMONARY 3/12 trach>>> A: Acute respiratory failure due to upper airway obstruction from neck abscess Tracheostomy (emergent by Suszanne Connerseoh 3/12) P:   ATC, wean FIO2 as able F/u CXR pulm hygiene  When post-op secretions improve and OK w/ ENT will trial cuff down, PMV and assess for diet.   CARDIOVASCULAR CVL A:  HTN  P:  Holding oral antihypertensives Add PRN labetolol for SBP >165 not improved w/ analgesia   RENAL A:  Mild hyponatremia presume from volume depletion  P:   IV hydration  F/u chem in am   GASTROINTESTINAL A:   No acute  Presume dysphagia s/p trach  P:   NPO for now Adv diet when we can let trach cuff down   HEMATOLOGIC A:  Leukocytosis  P:  Trend CBC Transfuse per usual ICU protocol  SCDs, then begin Raven heparin 3/13  INFECTIOUS A:  Neck abscess s/p I&D by ENT  P:   BCx2 3/12>>> Abscess 3/12>>> Clinda 3/12>>> unasyn 3/12>>> Will need repeat CT neck (timing TBD by ENT)  ENDOCRINE A:   Mild hyperglycemia  Hypothyroidism  P:   Change synthroid to IV for now CBG q 4, if consistently > 180 start SSI   NEUROLOGIC A:   No acute  Post-op pain  P:   RASS goal: 0 PRN  fentanyl    FAMILY  - Updated at bedside   - Inter-disciplinary family meet or Palliative Care meeting due by: 3/19    TODAY'S SUMMARY:  This is a 81 year old female admitted w/ large deep neck abscess. Now s/p I&D w/ emergent trach. Has had c/o swollen LNs but not skin abscess or evidence of cellulitis prior. Now in ICU. Will cont IV abx, check CXR to r/o aspiration and provide supportive care. Will defer timing of f/u CT neck to ENT. If secretions improved can prob let cuff down 3/13 and then assess for PMV and possibly swallowing soon after that. Will have ENT assist w/ determining timing of  decannulation.      Billy Fischer, MD ; [facility] [phone].  After 5:30 PM or weekends, call [phone] Pulmonary and Critical Care Medicine [facility] Pager: [phone]  03/20/2014, 4:37 PM

## 2014-03-20 NOTE — ED Notes (Addendum)
Patient comes from home where she lives alone.  Patient c/o swelling in her left oropharynx that impedes her ability to swallow.  Patient states she noticed the swelling 2-3 days ago and it has gotten progressively worse.  On exam, patient's lung sounds are clear bilaterally with no wheezing or stridor heard.  Patient is in NAD at this time.  Heart sounds are WNL, S1/S2 with no murmur.  +2 radial and pedal pulses palpated.  No pre-tibial or pedal edema noted.  Patient's bowel sounds are hyperactive.  Abdomen is soft and non-tender to palpation.  Patient does complain of some intermittent RUQ pain, but not with palpation.  PERRL, MAE.  Patient is ambulatory with a steady gait.

## 2014-03-20 NOTE — ED Notes (Addendum)
Patient was receiving lidocaine nebulized via RT and asked to go to bathroom.  Patient was asked to wait until her lidocaine tx was done.  Clindamycin was hung.  Patient acutely began to have stridor.  EDP [name] called to bedside.  Patient moved to Res room.  As we moved, patient began to seize and caynosis noted to lips.  EDP [name] attempted intubation x3.  Patient's airway was edematous and secretions limited view.  Anesthesia called and [name], CRNA to bedside.  Dr. [name] to bedside for emergency trach if necessary. Bagging was successful with no obstruction.  Patient's sats maintained 97-100% at all times during episode.  Patient taken emergently to OR at 11:15.  Vitals at that time:  179/81, HR 120, 100% bagging, 30 RR.  Clinda was stopped at 1050.  Events as follows:  10:40 - started bagging 10:52 - suction 10:53 - Etomidate 10 mg given 1054 - suction 1056 - bagging 1058 - Anesthesia at bedside 1059 - suction 1102 - versed 2 mg given 1104 - propofol 50 mg given 1104 - CRNA attempted to intubate 1105 - bagging 1115 - Pt transported to OR

## 2014-03-20 NOTE — H&P (Signed)
Cc: Neck abscess, acute respiratory failure  HPI:  Gabrielle Whitney is an 81 y.o. female who presents to the [facility] emergency room earlier today complaining of dysphagia and swellingof her neck for the past 2-3 days. Her symptoms have progressively worsened. She has no previous history of ENT surgery. She also complains of occasional shortness of breath. Her Next CT Scan, She Was Noted to Have a 1-1/2 cm Abscess within the Left Neck at the Level of the Hyoid Bone. While she was in the emergency room, she acutely decompensated and became stridorous. Attempts to intubate the patient in the emergency room was unsuccessful. The patient was noted to have a severely erythematous larynx.  The patient was taken to the operating room emergently to secure her airway. Due to her severe airway edema, the decision was made for the patient to undergo the tracheostomy tube placement and drainage of her deep neck abscess.  No past medical history on file.  No past surgical history on file.  No family history on file.  Social History:  has no tobacco, alcohol, and drug history on file.  Allergies:  Allergies  Allergen Reactions  . Statins     Achy legs     Prior to Admission medications   Medication Sig Start Date End Date Taking? Authorizing Provider  ALPRAZolam Duanne Moron) 0.5 MG tablet Take 0.25-0.5 mg by mouth at bedtime as needed for anxiety.   Yes Historical Provider, MD  amLODipine (NORVASC) 5 MG tablet Take 5 mg by mouth every morning.   Yes Historical Provider, MD  levothyroxine (SYNTHROID, LEVOTHROID) 100 MCG tablet Take 100 mcg by mouth daily before breakfast.   Yes Historical Provider, MD  losartan (COZAAR) 50 MG tablet Take 50 mg by mouth daily.   Yes Historical Provider, MD  metoprolol (LOPRESSOR) 50 MG tablet Take 50 mg by mouth every morning.   Yes Historical Provider, MD  sertraline (ZOLOFT) 100 MG tablet Take 100 mg by mouth every morning.   Yes Historical Provider, MD    Results for  orders placed or performed during the hospital encounter of 03/20/14 (from the past 48 hour(s))  CBC     Status: Abnormal   Collection Time: 03/20/14  7:46 AM  Result Value Ref Range   WBC 12.5 (H) 4.0 - 10.5 K/uL   RBC 4.53 3.87 - 5.11 MIL/uL   Hemoglobin 14.5 12.0 - 15.0 g/dL   HCT 41.6 36.0 - 46.0 %   MCV 91.8 78.0 - 100.0 fL   MCH 32.0 26.0 - 34.0 pg   MCHC 34.9 30.0 - 36.0 g/dL   RDW 12.5 11.5 - 15.5 %   Platelets 204 150 - 400 K/uL  Basic metabolic panel     Status: Abnormal   Collection Time: 03/20/14  7:46 AM  Result Value Ref Range   Sodium 132 (L) 135 - 145 mmol/L   Potassium 3.8 3.5 - 5.1 mmol/L   Chloride 100 96 - 112 mmol/L   CO2 22 19 - 32 mmol/L   Glucose, Bld 134 (H) 70 - 99 mg/dL   BUN 14 6 - 23 mg/dL   Creatinine, Ser 0.64 0.50 - 1.10 mg/dL   Calcium 8.8 8.4 - 10.5 mg/dL   GFR calc non Af Amer 83 (L) >90 mL/min   GFR calc Af Amer >90 >90 mL/min    Comment: (NOTE) The eGFR has been calculated using the CKD EPI equation. This calculation has not been validated in all clinical situations. eGFR's persistently <90 mL/min  signify possible Chronic Kidney Disease.    Anion gap 10 5 - 15    Ct Soft Tissue Neck W Contrast  03/20/2014   CLINICAL DATA:  Left-sided neck swelling. Tenderness in the left neck. Pain with swallowing.  EXAM: CT NECK WITH CONTRAST  TECHNIQUE: Multidetector CT imaging of the neck was performed using the standard protocol following the bolus administration of intravenous contrast.  CONTRAST:  166mL OMNIPAQUE IOHEXOL 300 MG/ML  SOLN  COMPARISON:  None.  FINDINGS: Pharynx and larynx: Extensive mucosal and submucosal soft tissue swelling is present in the left neck from the level of the glossal tonsillar sulcus inferiorly to the piriform recess. A low-density peripherally enhancing collection at the level of the hyoid measures 14 x 9 x 7 mm, compatible with a developing abscess. The there is asymmetric soft tissue swelling within the left vallecula and  marked mass effect along the a left posterolateral oropharynx. The tongue base is otherwise unremarkable vocal cord scratch the there is some edema extending into the left false vocal cord. The true vocal cords are unremarkable and midline.  Inflammatory changes extend through the strap muscles on the left into the subcutaneous soft tissues.  Salivary glands: Ductal dilation and asymmetric enlargement is noted in the left submandibular gland. No obstructing stone is visible. The right submandibular gland and bilateral parotid glands are within normal limits.  Thyroid: Negative  Lymph nodes: Prominent jugulodigastric lymph nodes are evident bilaterally. No other significant adenopathy is present.  Vascular: Atherosclerotic calcifications are present in the the aorta at the origins the great vessels without significant stenosis. There is moderate tortuosity of the common carotid arteries bilaterally. Dense atherosclerotic calcifications are present at the carotid bifurcations bilaterally. There is no definite stenosis.  Limited intracranial: Within normal limits  Visualized orbits: Negative  Mastoids and visualized paranasal sinuses: The left sphenoid sinus is partially opacified. The visualized paranasal sinuses and the mastoid air cells are otherwise clear.  Skeleton: Degenerative changes are present throughout the cervical spine. There is fusion across the disc space at C3-4. Posterior elements are fused as well. Endplate degenerative change and facet disease is noted bilaterally at C4-5 with slight anterolisthesis. No focal lytic or blastic lesions are evident.  Upper chest: The lung apices are clear.  IMPRESSION: 1. Extensive mucosal and submucosal soft tissue swelling on the left from the glossal tonsillar sulcus through the piriform recess. 2. Low-density peripherally enhancing collection at the level the hyoid measures 14 x 9 x 7 mm, most compatible with a developing abscess. 3. This is most likely na  inflammatory process although underlying neoplasm is also considered with significant mass effect in the left posterolateral oropharynx no significant adenopathy is present. 4. Asymmetric enlargement and peripheral stranding about the left submandibular gland with dilation of the central ducts but no visible obstruction within the left submandibular duct outside of the gland. This may be a secondary effect of the inflammation. 5. Moderate atherosclerotic calcifications without a definite stenosis. 6. Multilevel spondylosis in the cervical spine.   Electronically Signed   By: San Morelle M.D.   On: 03/20/2014 09:27   Review of Systems  Constitutional: Negative for fever and chills.  HENT: Positive for postnasal drip, trouble swallowing and voice change. Negative for congestion, dental problem, ear discharge, ear pain, sinus pressure and tinnitus.  Respiratory: Positive for shortness of breath. Negative for cough.  Cardiovascular: Negative for chest pain.  Gastrointestinal: Negative for abdominal pain.  Neurological: Negative for headaches.  All other systems reviewed  and are negative.  Blood pressure 179/81, pulse 120, temperature 98.5 F (36.9 C), temperature source Oral, resp. rate 30, SpO2 100 %.  Physical Exam  Constitutional: She is awake but minimally respobsive. She is being ventilated by a mask per anesthesia. Head: Normocephalic and atraumatic.  Ears: Normal auricles and EACs. Mouth/Throat: Normal mucosa. No lesion. Eyes: Pupils are equal, round, and reactive to light.  Neck: Neck supple. No JVD present. No tracheal deviation present.  Cardiovascular: Normal rate and regular rhythm. Abdominal: Soft. She exhibits no distension.  Musculoskeletal: She exhibits no edema.  Neurological: She is awake but minimally responsive Skin: No rash noted. She is not diaphoretic.   Assessment/Plan: Left deep neck abscess, with acute respiratory distress secondary to airway obstruction.  Based on the above findings, the decision is made for patient to undergo tracheostomy tube placement and incision and drainage of her deep left neck abscess.  Chiniqua Kilcrease,SUI W 03/20/2014, 12:37 PM

## 2014-03-20 NOTE — Brief Op Note (Signed)
03/20/2014  12:48 PM  PATIENT:  Gabrielle Whitney  81 y.o. female  PRE-OPERATIVE DIAGNOSIS:  Left deep neck abscess, acute respiratory distress  POST-OPERATIVE DIAGNOSIS:  Left deep neck abscess, acute respiratory distress  PROCEDURE:  Procedure(s): 1. Tracheostomy 2. Incision and drainage of deep neck abscess  SURGEON:  Surgeon(s) and Role:    * Newman PiesSu Brittany Amirault, MD - Primary  PHYSICIAN ASSISTANT:   ASSISTANTS: none   ANESTHESIA:   general  EBL:  Total I/O In: 500 [I.V.:500] Out: -   BLOOD ADMINISTERED: None  DRAINS: none   LOCAL MEDICATIONS USED:  LIDOCAINE   SPECIMEN:  Source of Specimen:  Abscess fluid  DISPOSITION OF SPECIMEN:  Microbiology  COUNTS:  YES  TOURNIQUET:  * No tourniquets in log *  DICTATION: .Other Dictation: Dictation Number [id number]  PLAN OF CARE: Admit to inpatient   PATIENT DISPOSITION:  ICU - intubated and hemodynamically stable.   Delay start of Pharmacological VTE agent (>24hrs) due to surgical blood loss or risk of bleeding: no

## 2014-03-20 NOTE — ED Notes (Signed)
Respiratory at bedside.

## 2014-03-20 NOTE — Progress Notes (Signed)
eLink Physician-Brief Progress Note Patient Name: Gabrielle Whitney DOB: 02/02/1933 MRN: [id number]   Date of Service  03/20/2014  HPI/Events of Note  Hyperglycemia developing post op on dextrose containing solution in pt not previously dm  eICU Interventions  Change to half NS at same rate      Intervention Category Major Interventions: Hyperglycemia - active titration of insulin therapy Intermediate Interventions: Hyperglycemia - evaluation and treatment  Sandrea HughsMichael Nobuo Nunziata 03/20/2014, 6:37 PM

## 2014-03-20 NOTE — Anesthesia Postprocedure Evaluation (Signed)
  Anesthesia Post-op Note  Patient: Gabrielle Whitney  Procedure(s) Performed: Procedure(s): AWAKE INTUBATION, TRACHEOSTOMY WITH DRAINAGE OF ABSCESS (N/A)  Patient Location: PACU and ICU  Anesthesia Type:General  Level of Consciousness: awake, alert  and responds to stimulation  Airway and Oxygen Therapy: Patient connected to tracheostomy mask oxygen  Post-op Pain: mild  Post-op Assessment: Post-op Vital signs reviewed, Patient's Cardiovascular Status Stable, Respiratory Function Stable, Patent Airway, No signs of Nausea or vomiting and Pain level controlled  Post-op Vital Signs: Reviewed and stable  Last Vitals:  Filed Vitals:   03/20/14 1110  BP: 179/81  Pulse: 120  Temp:   Resp: 30    Complications: No apparent anesthesia complications

## 2014-03-20 NOTE — Anesthesia Procedure Notes (Signed)
Procedure Name: Intubation Date/Time: 03/20/2014 11:33 AM Performed by: Orest DikesPETERS, Felicita Nuncio J Pre-anesthesia Checklist: Patient identified, Emergency Drugs available, Suction available, Patient being monitored and Timeout performed Patient Re-evaluated:Patient Re-evaluated prior to inductionOxygen Delivery Method: Circle System Utilized Preoxygenation: Pre-oxygenation with 100% oxygen Intubation Type: IV induction Ventilation: Mask ventilation without difficulty Tube type: Oral Tube size: 6.5 (Taper guard) mm Number of attempts: 1 Airway Equipment and Method: Stylet,  Oral airway,  Fiberoptic brochoscope and Video-laryngoscopy Placement Confirmation: ETT inserted through vocal cords under direct vision,  positive ETCO2 and breath sounds checked- equal and bilateral Secured at: 21 cm Tube secured with: Tape Dental Injury: Teeth and Oropharynx as per pre-operative assessment  Comments: See Dr Edison PaceJackson's note in chart. Fiberoptic intubation with aid of glidescope for intubation with 6.5 taperguard ETT. ENT surgeon and Dr [name] at bedside. Will proceed with trach per ENT surgeon.

## 2014-03-20 NOTE — ED Notes (Signed)
Patient transported to CT 

## 2014-03-20 NOTE — ED Notes (Signed)
MD at bedside. 

## 2014-03-20 NOTE — Transfer of Care (Signed)
Immediate Anesthesia Transfer of Care Note  Patient: Gabrielle Whitney  Procedure(s) Performed: Procedure(s): AWAKE INTUBATION, TRACHEOSTOMY WITH DRAINAGE OF ABSCESS (N/A)  Patient Location: ICU  Anesthesia Type:General  Level of Consciousness: awake  Airway & Oxygen Therapy: Patient connected to tracheostomy mask oxygen  Post-op Assessment: Report given to RN and Post -op Vital signs reviewed and stable  Post vital signs: Reviewed and stable  Last Vitals:  Filed Vitals:   03/20/14 1110  BP: 179/81  Pulse: 120  Temp:   Resp: 30    Complications: No apparent anesthesia complications

## 2014-03-20 NOTE — Progress Notes (Signed)
ANTIBIOTIC CONSULT NOTE - INITIAL  Pharmacy Consult for Unasyn Indication: Neck abscess  Allergies  Allergen Reactions  . Statins     Achy legs     Patient Measurements:    Vital Signs: Temp: 98.5 F (36.9 C) (03/12 0925) Temp Source: Oral (03/12 0925) BP: 167/72 mmHg (03/12 1241) Pulse Rate: 105 (03/12 1241) Intake/Output from previous day:   Intake/Output from this shift: Total I/O In: 500 [I.V.:500] Out: -   Labs:  Recent Labs  03/20/14 0746  WBC 12.5*  HGB 14.5  PLT 204  CREATININE 0.64   CrCl cannot be calculated (Unknown ideal weight.). No results for input(s): VANCOTROUGH, VANCOPEAK, VANCORANDOM, GENTTROUGH, GENTPEAK, GENTRANDOM, TOBRATROUGH, TOBRAPEAK, TOBRARND, AMIKACINPEAK, AMIKACINTROU, AMIKACIN in the last 72 hours.   Microbiology: No results found for this or any previous visit (from the past 720 hour(s)).  Medical History: No past medical history on file.  Medications:  Scheduled:  . clindamycin (CLEOCIN) IV  300 mg Intravenous 3 times per day  . etomidate      . [START ON 03/21/2014] heparin  5,000 Units Subcutaneous 3 times per day  . levothyroxine  50 mcg Intravenous Daily  . lidocaine (cardiac) 100 mg/695ml      . propofol      . rocuronium      . succinylcholine       Infusions:  . dextrose 5 % and 0.45% NaCl     PRN:   Assessment: 81 year old [facility] w/ large left deep neck abscess and upper airway obstruction. Went to OR for emergent I&D w/ trach for airway. Pharmacy is consulted to dose Unasyn.  3/12 >> Unasyn >> 3/12 >> Clinda >>  Tmax: Afebrile WBC: 12.5k Renal: SCr 0.6, CrCl 65 ml/min Normalized (using 0.8)  3/12 neck abscess: 3/12 neck abscess (anaerobic): 3/12 blood:  Goal of Therapy:  Dosage appropriate for renal function  Plan:   Unasyn 1.5g IV q6h  Clindamycin per MD Follow up renal function & cultures  Loralee PacasErin Mathea Frieling, PharmD, BCPS Pager: [phone] 03/20/2014,2:21 PM

## 2014-03-20 NOTE — Op Note (Signed)
Gabrielle Whitney, Gabrielle Whitney                 ACCOUNT NO.:  [id number]  MEDICAL RECORD NO.:  [id number]  LOCATION:  1226                         FACILITY:  [facility] Inpatient  PHYSICIAN:  Newman Pies, MD            DATE OF BIRTH:  08/31/1933  DATE OF PROCEDURE:  03/20/2014 DATE OF DISCHARGE:                              OPERATIVE REPORT   SURGEON:  Newman Pies, MD  PREOPERATIVE DIAGNOSES: 1. Left deep neck abscess. 2. Acute respiratory distress.  POSTOPERATIVE DIAGNOSES: 1. Left deep neck abscess. 2. Acute respiratory distress.  PROCEDURE PERFORMED: 1. Tracheostomy. 2. Incision and drainage of deep left neck abscess.  ANESTHESIA:  General endotracheal tube anesthesia.  COMPLICATIONS:  None.  ESTIMATED BLOOD LOSS:  Minimal.  INDICATION FOR PROCEDURE:  The patient is a 81 year old female, who presented to the [facility] Emergency Room earlier today, complaining of dysphagia and swelling sensation in her neck for the past 2-3 days. She also complains of occasional shortness of breath.  On her CT scan, she was noted to have a 1.5-cm abscess pocket within her neck, at the level of the hyoid bone.  While she was in the emergency room, she developed respiratory distress with stridorous breathing.  Multiple attempts to intubate her were unsuccessful.  She was noted to have severely edematous airway.  Based on the above findings, the decision was made for patient to undergo emergent tracheostomy tube placement and incision and drainage of her left neck abscess.  DESCRIPTION:  The patient was taken to the operating room and placed supine on the operating table.  General endotracheal tube anesthesia was administered by the anesthesiologist.  The patient was positioned and prepped and draped in the standard fashion for above-stated procedures. IV clindamycin was also given prior to the surgery.  A 1% lidocaine with 1:100,000 epinephrine was injected at the anterior neck.  Attention was first focused on  draining the deep neck abscess.  A transverse incision was made over the level of the hyoid bone.  The incision was carried down to the level of the hyoid bone.  The muscular attachment to the inferior hyoid bone was carefully separated.  An 18- gauge needle was then used to pass beneath the hyoid bone into the abscess pocket.  Approximately 1.5 mL of abscess fluid was drained.  The abscess fluid was sent to the microbiology department for analysis.  The surgical site was copiously irrigated.  Incision was closed in layers with 4-0 Vicryl and Dermabond.  Attention was then focused on performing the tracheostomy placement.  A vertical incision was made at the midline.  The incision was carried down to the level of the strap muscles.  The strap muscles were divided at midline and retracted laterally, exposing the thyroid gland.  The thyroid gland was divided at midline.  The tracheal wall was subsequently identified.  A tracheal window was made at the level of the second tracheal ring.  The endotracheal tube was withdrawn.  A #8 tracheostomy tube was placed without difficulty.  Good CO2 return was noted.  The tracheostomy tube was then secured in place with 4-0 Prolene sutures and circumferential neck tie.  The  care of the patient was turned over to the anesthesiologist.  The patient was awakened from anesthesia without difficulty.  She was transferred to the ICU in good condition.  OPERATIVE FINDINGS: 1. Deep left neck abscess was noted.  Approximately 1.5 mL of abscess     fluid was drained. 2. A #8 tracheostomy tube was placed.  SPECIMEN:  Abscess fluid.  FOLLOWUP CARE:  The patient will be taken to the intensive care unit. She will be treated with IV antibiotics and weaning of her ventilator support.     Newman PiesSu Jarid Sasso, MD     ST/MEDQ  D:  03/20/2014  T:  03/20/2014  Job:  [id number]

## 2014-03-20 NOTE — Consult Note (Deleted)
PULMONARY / CRITICAL CARE MEDICINE   Name: Gabrielle Whitney MRN: [id number] DOB: 10-Jun-1933    ADMISSION DATE:  03/20/2014 CONSULTATION DATE:  3/12  REFERRING MD :  Daphine Deutscher   CHIEF COMPLAINT:    INITIAL PRESENTATION:  81 year old female admitted to [facility] on 3/12 w/ large left deep neck abscess and upper airway obstruction. Went to OR for emergent I&D w/ trach for airway. PCCM asked to see post-op.   STUDIES:  CT neck: . Extensive mucosal and submucosal soft tissue swelling on the left from the glossal tonsillar sulcus through the piriform recess. Low-density peripherally enhancing collection at the level the hyoid measuAsymmetric enlargement and peripheral stranding about the left submandibular gland with dilation of the central ducts but no visible obstruction within the left submandibular duct outside of the gland. res 14 x 9 x 7 mm, most compatible with a developing Abscess.  SIGNIFICANT EVENTS:    HISTORY OF PRESENT ILLNESS:   81 y.o. female who presents to the [facility] emergency room on 3/12 complaining of dysphagia and swelling of her neck for the past 2-3 days. Her symptoms had progressively worsened. She has no previous history of ENT surgery. She also complains of occasional shortness of breath. Her Next CT Scan, She Was Noted to Have a 1-1/2 cm Abscess within the Left Neck at the Level of the Hyoid Bone. While she was in the emergency room, she acutely decompensated and became stridorous. Attempts to intubate the patient in the emergency room was unsuccessful. The patient was noted to have a severely erythematous larynx. The patient was taken to the operating room emergently to secure her airway. Due to her severe airway edema, the decision was made for the patient to undergo the tracheostomy tube placement and drainage of her deep neck abscess. She returned to the ICU for recovery. PCCM   PAST MEDICAL HISTORY :   has no past medical history on file.  has no past surgical history on  file. Prior to Admission medications   Medication Sig Start Date End Date Taking? Authorizing Provider  ALPRAZolam Prudy Feeler) 0.5 MG tablet Take 0.25-0.5 mg by mouth at bedtime as needed for anxiety.   Yes Historical Provider, MD  amLODipine (NORVASC) 5 MG tablet Take 5 mg by mouth every morning.   Yes Historical Provider, MD  levothyroxine (SYNTHROID, LEVOTHROID) 100 MCG tablet Take 100 mcg by mouth daily before breakfast.   Yes Historical Provider, MD  losartan (COZAAR) 50 MG tablet Take 50 mg by mouth daily.   Yes Historical Provider, MD  metoprolol (LOPRESSOR) 50 MG tablet Take 50 mg by mouth every morning.   Yes Historical Provider, MD  sertraline (ZOLOFT) 100 MG tablet Take 100 mg by mouth every morning.   Yes Historical Provider, MD   Allergies  Allergen Reactions  . Statins     Achy legs     FAMILY HISTORY:  has no family status information on file.  SOCIAL HISTORY:    REVIEW OF SYSTEMS:  Unable   SUBJECTIVE:  No distress  VITAL SIGNS: Temp:  [98.4 F (36.9 C)-98.5 F (36.9 C)] 98.5 F (36.9 C) (03/12 0925) Pulse Rate:  [101-120] 105 (03/12 1241) Resp:  [18-30] 20 (03/12 1241) BP: (167-180)/(69-81) 167/72 mmHg (03/12 1241) SpO2:  [92 %-100 %] 92 % (03/12 1241) HEMODYNAMICS:   VENTILATOR SETTINGS:   INTAKE / OUTPUT:  Intake/Output Summary (Last 24 hours) at 03/20/14 1258 Last data filed at 03/20/14 1247  Gross per 24 hour  Intake  500 ml  Output      0 ml  Net    500 ml    PHYSICAL EXAMINATION: General:  81 year old white female resting in bed s/p trach and I&D of neck abscess  Neuro:  Awake, alert, no focal def  HEENT:  # 6 cuffed trach w/ bloody drainage from stoma. Surgical incision site above trach unremarkable and well approximated.  Cardiovascular:  rrr Lungs:  Decreased on right, some scattered rhonchi no accessory muscle use  Abdomen:  + bowel sounds no OM  Musculoskeletal:  Intact  Skin:  Intact   LABS:  CBC  Recent Labs Lab  03/20/14 0746  WBC 12.5*  HGB 14.5  HCT 41.6  PLT 204   Coag's No results for input(s): APTT, INR in the last 168 hours. BMET  Recent Labs Lab 03/20/14 0746  NA 132*  K 3.8  CL 100  CO2 22  BUN 14  CREATININE 0.64  GLUCOSE 134*   Electrolytes  Recent Labs Lab 03/20/14 0746  CALCIUM 8.8   Sepsis Markers No results for input(s): LATICACIDVEN, PROCALCITON, O2SATVEN in the last 168 hours. ABG No results for input(s): PHART, PCO2ART, PO2ART in the last 168 hours. Liver Enzymes No results for input(s): AST, ALT, ALKPHOS, BILITOT, ALBUMIN in the last 168 hours. Cardiac Enzymes No results for input(s): TROPONINI, PROBNP in the last 168 hours. Glucose No results for input(s): GLUCAP in the last 168 hours.  Imaging No results found.   ASSESSMENT / PLAN:  PULMONARY 3/12 trach>>> A: Acute respiratory failure due to upper airway obstruction from neck abscess Tracheostomy (emergent by Suszanne Connerseoh 3/12) P:   ATC, wean FIO2 as able F/u CXR pulm hygiene  When post-op secretions improve and OK w/ ENT will trial cuff down, PMV and assess for diet.   CARDIOVASCULAR CVL A:  HTN  P:  Holding oral antihypertensives Add PRN labetolol for SBP >165 not improved w/ analgesia   RENAL A:  Mild hyponatremia presume from volume depletion  P:   IV hydration  F/u chem in am   GASTROINTESTINAL A:   No acute  Presume dysphagia s/p trach  P:   NPO for now Adv diet when we can let trach cuff down   HEMATOLOGIC A:  Leukocytosis  P:  Trend CBC Transfuse per usual ICU protocol  SCDs, then begin [place] heparin 3/13  INFECTIOUS A:  Neck abscess s/p I&D by ENT  P:   BCx2 3/12>>> Abscess 3/12>>> Clinda 3/12>>> unasyn 3/12>>> Will need repeat CT neck (timing TBD by ENT)  ENDOCRINE A:   Mild hyperglycemia  Hypothyroidism  P:   Change synthroid to IV for now CBG q 4, if consistently > 180 start SSI   NEUROLOGIC A:   No acute  Post-op pain  P:   RASS goal: 0 PRN  fentanyl    FAMILY  - Updated at bedside   - Inter-disciplinary family meet or Palliative Care meeting due by: 3/19    TODAY'S SUMMARY:  This is a 81 year old female admitted w/ large deep neck abscess. Now s/p I&D w/ emergent trach. Has had c/o swollen LNs but not skin abscess or evidence of cellulitis prior. Now in ICU. Will cont IV abx, check CXR to r/o aspiration and provide supportive care. Will defer timing of f/u CT neck to ENT. If secretions improved can prob let cuff down 3/13 and then assess for PMV and possibly swallowing soon after that. Will have ENT assist w/ determining timing of  decannulation.       Pulmonary and Critical Care Medicine [facility] Pager: [phone]  03/20/2014, 12:58 PM

## 2014-03-20 NOTE — Anesthesia Preprocedure Evaluation (Signed)
Anesthesia Evaluation  Patient identified by MRN, date of birth, ID band Patient confused    Reviewed: Allergy & Precautions, H&P , Patient's Chart, lab work & pertinent test results, reviewed documented beta blocker date and time , Unable to perform ROS - Chart review onlyPreop documentation limited or incomplete due to emergent nature of procedure.  History of Anesthesia Complications Negative for: history of anesthetic complications  Airway Mallampati: IV  TM Distance: <3 FB Neck ROM: full  Mouth opening: Limited Mouth Opening  Dental   Pulmonary  breath sounds clear to auscultation        Cardiovascular Exercise Tolerance: Good hypertension, Rhythm:regular Rate:Normal     Neuro/Psych    GI/Hepatic   Endo/Other    Renal/GU      Musculoskeletal   Abdominal   Peds  Hematology   Anesthesia Other Findings   Reproductive/Obstetrics                             Anesthesia Physical Anesthesia Plan  ASA: IV and emergent  Anesthesia Plan: General ETT   Post-op Pain Management:    Induction: Rapid sequence, Cricoid pressure planned and Intravenous  Airway Management Planned: Video Laryngoscope Planned and Fiberoptic Intubation Planned  Additional Equipment:   Intra-op Plan:   Post-operative Plan:   Informed Consent: I have reviewed the patients History and Physical, chart, labs and discussed the procedure including the risks, benefits and alternatives for the proposed anesthesia with the patient or authorized representative who has indicated his/her understanding and acceptance.   Dental Advisory Given  Plan Discussed with: CRNA and Surgeon  Anesthesia Plan Comments:        patient with obstructed airway probable pharyngeal abcess... Will attempt to intubate with glidescope and or fiberoptic scope... Surgeon on stand by or emergent trach if we cannot secure the airway Anesthesia  Quick Evaluation

## 2014-03-20 NOTE — ED Notes (Signed)
Pt's eye glasses given to security.

## 2014-03-20 NOTE — ED Notes (Signed)
EMS called to home.  Found patient ambulatory in house with complaints of left neck swelling that Started 2 days ago.  Patient complains of tenderness to the area.  Patient states that she is having  Pain swallowing.

## 2014-03-21 ENCOUNTER — Encounter (HOSPITAL_COMMUNITY): Payer: Self-pay

## 2014-03-21 DIAGNOSIS — J9600 Acute respiratory failure, unspecified whether with hypoxia or hypercapnia: Secondary | ICD-10-CM

## 2014-03-21 LAB — BASIC METABOLIC PANEL
Anion gap: 7 (ref 5–15)
BUN: 9 mg/dL (ref 6–23)
CO2: 26 mmol/L (ref 19–32)
Calcium: 8 mg/dL — ABNORMAL LOW (ref 8.4–10.5)
Chloride: 103 mmol/L (ref 96–112)
Creatinine, Ser: 0.7 mg/dL (ref 0.50–1.10)
GFR calc Af Amer: >90 mL/min (ref >90)
GFR calc non Af Amer: 81 mL/min — ABNORMAL LOW (ref >90)
Glucose, Bld: 148 mg/dL — ABNORMAL HIGH (ref 70–99)
Potassium: 4.4 mmol/L (ref 3.5–5.1)
Sodium: 136 mmol/L (ref 135–145)

## 2014-03-21 LAB — CBC
HCT: 39.6 % (ref 36.0–46.0)
HEMOGLOBIN: 13.2 g/dL (ref 12.0–15.0)
MCH: 30.8 pg (ref 26.0–34.0)
MCHC: 33.3 g/dL (ref 30.0–36.0)
MCV: 92.3 fL (ref 78.0–100.0)
PLATELETS: 238 10*3/uL (ref 150–400)
RBC: 4.29 MIL/uL (ref 3.87–5.11)
RDW: 12.6 % (ref 11.5–15.5)
WBC: 12.9 10*3/uL — ABNORMAL HIGH (ref 4.0–10.5)

## 2014-03-21 LAB — MAGNESIUM: MAGNESIUM: 1.9 mg/dL (ref 1.5–2.5)

## 2014-03-21 LAB — GLUCOSE, CAPILLARY
GLUCOSE-CAPILLARY: 141 mg/dL — AB (ref 70–99)
GLUCOSE-CAPILLARY: 118 mg/dL — AB (ref 70–99)

## 2014-03-21 LAB — PHOSPHORUS: PHOSPHORUS: 2.7 mg/dL (ref 2.3–4.6)

## 2014-03-21 MED ORDER — METOPROLOL TARTRATE 1 MG/ML IV SOLN: 5.0000 mg | INTRAVENOUS | Status: DC | PRN

## 2014-03-21 MED ORDER — POTASSIUM CHLORIDE CRYS ER 20 MEQ PO TBCR
EXTENDED_RELEASE_TABLET | ORAL | Status: AC
  Filled 2014-03-21: qty 2

## 2014-03-21 NOTE — Progress Notes (Signed)
PULMONARY / CRITICAL CARE MEDICINE   Name: Gabrielle Whitney MRN: [id number] DOB: 10/15/1933    ADMISSION DATE:  03/20/2014 CONSULTATION DATE:  3/12  REFERRING MD :  [name]   CHIEF COMPLAINT:    INITIAL PRESENTATION:  81 year old [facility] w/ large left deep neck abscess and upper airway obstruction. Went to OR for emergent I&D w/ trach for airway. PCCM asked to see post-op.   STUDIES:  CT neck: . Extensive mucosal and submucosal soft tissue swelling on the left from the glossal tonsillar sulcus through the piriform recess. Low-density peripherally enhancing collection at the level the hyoid measuAsymmetric enlargement and peripheral stranding about the left submandibular gland with dilation of the central ducts but no visible obstruction within the left submandibular duct outside of the gland. res 14 x 9 x 7 mm, most compatible with a developing abscess.    SIGNIFICANT EVENTS:   SUBJECTIVE:  No distress  VITAL SIGNS: Temp:  [98.7 F (37.1 C)-99.6 F (37.6 C)] 99.6 F (37.6 C) (03/13 0800) Pulse Rate:  [69-120] 82 (03/13 1000) Resp:  [15-30] 19 (03/13 1000) BP: (120-179)/(51-143) 160/71 mmHg (03/13 1000) SpO2:  [92 %-100 %] 97 % (03/13 1000) FiO2 (%):  [28 %] 28 % (03/13 0807) Weight:  [76.1 kg (167 lb 12.3 oz)] 76.1 kg (167 lb 12.3 oz) (03/13 0448) HEMODYNAMICS:   VENTILATOR SETTINGS: Vent Mode:  [-]  FiO2 (%):  [28 %] 28 % INTAKE / OUTPUT:  Intake/Output Summary (Last 24 hours) at 03/21/14 1010 Last data filed at 03/21/14 1000  Gross per 24 hour  Intake   3150 ml  Output   2077 ml  Net   1073 ml    PHYSICAL EXAMINATION: General:  81 year old white female resting in bed s/p trach and I&D of neck abscess  Neuro:  Awake, alert, no focal def  HEENT:  # 6 cuffed trach w/ bloody drainage from stoma. Surgical incision site ok  Cardiovascular:  rrr Lungs:  Decreased on right, some scattered rhonchi no accessory muscle use  Abdomen:  + bowel sounds no  OM  Musculoskeletal:  Intact  Skin:  Intact   LABS:  CBC  Recent Labs Lab 03/20/14 0746 03/20/14 1635 03/21/14 0406  WBC 12.5* 14.1* 12.9*  HGB 14.5 14.2 13.2  HCT 41.6 41.7 39.6  PLT 204 225 238   Coag's No results for input(s): APTT, INR in the last 168 hours. BMET  Recent Labs Lab 03/20/14 0746 03/20/14 1635 03/21/14 0406  NA 132*  --  136  K 3.8  --  4.4  CL 100  --  103  CO2 22  --  26  BUN 14  --  9  CREATININE 0.64 0.71 0.70  GLUCOSE 134*  --  148*   Electrolytes  Recent Labs Lab 03/20/14 0746 03/21/14 0406  CALCIUM 8.8 8.0*  MG  --  1.9  PHOS  --  2.7   Sepsis Markers No results for input(s): LATICACIDVEN, PROCALCITON, O2SATVEN in the last 168 hours. ABG No results for input(s): PHART, PCO2ART, PO2ART in the last 168 hours. Liver Enzymes No results for input(s): AST, ALT, ALKPHOS, BILITOT, ALBUMIN in the last 168 hours. Cardiac Enzymes No results for input(s): TROPONINI, PROBNP in the last 168 hours. Glucose  Recent Labs Lab 03/20/14 1651 03/20/14 2024 03/21/14 0803  GLUCAP 230* 179* 141*    Imaging Ct Soft Tissue Neck W Contrast  03/20/2014   CLINICAL DATA:  Left-sided neck swelling.  Tenderness in the left neck. Pain with swallowing.  EXAM: CT NECK WITH CONTRAST  TECHNIQUE: Multidetector CT imaging of the neck was performed using the standard protocol following the bolus administration of intravenous contrast.  CONTRAST:  OMNIPAQUE IOHEXOL 300 MG/ML  SOLN  COMPARISON:  None.  FINDINGS: Pharynx and larynx: Extensive mucosal and submucosal soft tissue swelling is present in the left neck from the level of the glossal tonsillar sulcus inferiorly to the piriform recess. A low-density peripherally enhancing collection at the level of the hyoid measures 14 x 9 x 7 mm, compatible with a developing abscess. The there is asymmetric soft tissue swelling within the left vallecula and marked mass effect along the a left posterolateral oropharynx.  The tongue base is otherwise unremarkable vocal cord scratch the there is some edema extending into the left false vocal cord. The true vocal cords are unremarkable and midline.  Inflammatory changes extend through the strap muscles on the left into the subcutaneous soft tissues.  Salivary glands: Ductal dilation and asymmetric enlargement is noted in the left submandibular gland. No obstructing stone is visible. The right submandibular gland and bilateral parotid glands are within normal limits.  Thyroid: Negative  Lymph nodes: Prominent jugulodigastric lymph nodes are evident bilaterally. No other significant adenopathy is present.  Vascular: Atherosclerotic calcifications are present in the the aorta at the origins the great vessels without significant stenosis. There is moderate tortuosity of the common carotid arteries bilaterally. Dense atherosclerotic calcifications are present at the carotid bifurcations bilaterally. There is no definite stenosis.  Limited intracranial: Within normal limits  Visualized orbits: Negative  Mastoids and visualized paranasal sinuses: The left sphenoid sinus is partially opacified. The visualized paranasal sinuses and the mastoid air cells are otherwise clear.  Skeleton: Degenerative changes are present throughout the cervical spine. There is fusion across the disc space at C3-4. Posterior elements are fused as well. Endplate degenerative change and facet disease is noted bilaterally at C4-5 with slight anterolisthesis. No focal lytic or blastic lesions are evident.  Upper chest: The lung apices are clear.  IMPRESSION: 1. Extensive mucosal and submucosal soft tissue swelling on the left from the glossal tonsillar sulcus through the piriform recess. 2. Low-density peripherally enhancing collection at the level the hyoid measures 14 x 9 x 7 mm, most compatible with a developing abscess. 3. This is most likely na inflammatory process although underlying neoplasm is also considered  with significant mass effect in the left posterolateral oropharynx no significant adenopathy is present. 4. Asymmetric enlargement and peripheral stranding about the left submandibular gland with dilation of the central ducts but no visible obstruction within the left submandibular duct outside of the gland. This may be a secondary effect of the inflammation. 5. Moderate atherosclerotic calcifications without a definite stenosis. 6. Multilevel spondylosis in the cervical spine.   Electronically Signed   By: Marin Roberts M.D.   On: 03/20/2014 09:27     ASSESSMENT / PLAN:  PULMONARY 3/12 trach>>> A: Acute respiratory failure due to upper airway obstruction from neck abscess Tracheostomy (emergent by Suszanne Conners 3/12) P:   ATC, wean FIO2 as able pulm hygiene  Speech/swallow eval   CARDIOVASCULAR CVL A:  HTN  P:  Holding oral antihypertensives Add PRN labetolol for SBP >165 not improved w/ analgesia   RENAL A:  Mild hyponatremia presume from volume depletion  P:   IV hydration    GASTROINTESTINAL A:   No acute  Presume dysphagia s/p trach  P:   NPO for  now Adv diet when we can let trach cuff down -hope in 24h  HEMATOLOGIC A:  Leukocytosis  P:  Trend CBC Transfuse per usual ICU protocol  SCDs, then begin [place] heparin 3/13  INFECTIOUS A:  Neck abscess s/p I&D by ENT  P:   BCx2 3/12>>> Abscess 3/12>>> Clinda 3/12>>> unasyn 3/12>>> Will need repeat CT neck (timing TBD by ENT)  ENDOCRINE A:   Mild hyperglycemia  Hypothyroidism  P:   Change synthroid to IV for now CBG q 4, if consistently > 180 start SSI   NEUROLOGIC A:   No acute  Post-op pain  P:   RASS goal: 0 PRN fentanyl  oob   FAMILY  - Updated -pt & son  at bedside   - Inter-disciplinary family meet or Palliative Care meeting due by: 3/19    TODAY'S SUMMARY:  This is a 81 year old female admitted w/ large deep neck abscess. Now s/p I&D w/ emergent trach.Will defer timing of f/u CT neck to  ENT. If secretions improved can prob let cuff down 3/13 and then assess for PMV and possibly swallowing soon after that. Will have ENT assist w/ determining timing of decannulation.     The patient is critically ill with multiple organ systems failure and requires high complexity decision making for assessment and support, frequent evaluation and titration of therapies, application of advanced monitoring technologies and extensive interpretation of multiple databases. Critical Care Time devoted to patient care services described in this note independent of APP time is 31 minutes.   Oretha Milch MD  03/21/2014, 10:10 AM

## 2014-03-22 ENCOUNTER — Encounter (HOSPITAL_COMMUNITY): Payer: Self-pay | Admitting: Otolaryngology

## 2014-03-22 LAB — GLUCOSE, CAPILLARY
GLUCOSE-CAPILLARY: 125 mg/dL — AB (ref 70–99)
Glucose-Capillary: 110 mg/dL — ABNORMAL HIGH (ref 70–99)
Glucose-Capillary: 113 mg/dL — ABNORMAL HIGH (ref 70–99)
Glucose-Capillary: 94 mg/dL (ref 70–99)
Glucose-Capillary: 98 mg/dL (ref 70–99)
Glucose-Capillary: 99 mg/dL (ref 70–99)
Glucose-Capillary: 99 mg/dL (ref 70–99)

## 2014-03-22 LAB — CBC
HCT: 38.8 % (ref 36.0–46.0)
Hemoglobin: 13.3 g/dL (ref 12.0–15.0)
MCH: 31.5 pg (ref 26.0–34.0)
MCHC: 34.3 g/dL (ref 30.0–36.0)
MCV: 91.9 fL (ref 78.0–100.0)
PLATELETS: 234 10*3/uL (ref 150–400)
RBC: 4.22 MIL/uL (ref 3.87–5.11)
RDW: 12.8 % (ref 11.5–15.5)
WBC: 10.7 10*3/uL — ABNORMAL HIGH (ref 4.0–10.5)

## 2014-03-22 LAB — BASIC METABOLIC PANEL
ANION GAP: 7 (ref 5–15)
BUN: 12 mg/dL (ref 6–23)
CO2: 25 mmol/L (ref 19–32)
Calcium: 8 mg/dL — ABNORMAL LOW (ref 8.4–10.5)
Chloride: 105 mmol/L (ref 96–112)
Creatinine, Ser: 0.61 mg/dL (ref 0.50–1.10)
GFR calc Af Amer: >90 mL/min (ref >90)
GFR calc non Af Amer: 85 mL/min — ABNORMAL LOW (ref >90)
GLUCOSE: 102 mg/dL — AB (ref 70–99)
Potassium: 3.3 mmol/L — ABNORMAL LOW (ref 3.5–5.1)
Sodium: 137 mmol/L (ref 135–145)

## 2014-03-22 MED ORDER — LORAZEPAM 2 MG/ML IJ SOLN
1.0000 mg | Freq: Four times a day (QID) | INTRAMUSCULAR | Status: DC | PRN
  Administered 2014-03-22 – 2014-03-24 (×3): 1 mg via INTRAVENOUS
  Filled 2014-03-22 (×3): qty 1

## 2014-03-22 NOTE — Progress Notes (Signed)
Subjective: Pt has no new complaint. Would like to eat/drink.  Objective: Vital signs in last 24 hours: Temp:  [98.2 F (36.8 C)-99.6 F (37.6 C)] 98.2 F (36.8 C) (03/14 0430) Pulse Rate:  [78-82] 82 (03/14 0430) Resp:  [16-20] 20 (03/14 0430) BP: (139-153)/(62-69) 143/62 mmHg (03/14 0430) SpO2:  [95 %-99 %] 98 % (03/14 1154) FiO2 (%):  [28 %] 28 % (03/14 1154) Weight:  [162 lb 4.1 oz (73.6 kg)] 162 lb 4.1 oz (73.6 kg) (03/14 0900)  Neck incision c/d/i Trach midline. Unable to phonate with cuff down.   Recent Labs  03/21/14 0406 03/22/14 0444  WBC 12.9* 10.7*  HGB 13.2 13.3  HCT 39.6 38.8  PLT 238 234    Recent Labs  03/21/14 0406 03/22/14 0444  NA 136 137  K 4.4 3.3*  CL 103 105  CO2 26 25  GLUCOSE 148* 102*  BUN 9 12  CREATININE 0.70 0.61  CALCIUM 8.0* 8.0*    Medications:  I have reviewed the patient's current medications. Scheduled: . ampicillin-sulbactam (UNASYN) IV  1.5 g Intravenous Q6H  . clindamycin (CLEOCIN) IV  300 mg Intravenous 3 times per day  . heparin  5,000 Units Subcutaneous 3 times per day  . levothyroxine  50 mcg Intravenous Daily   WUJ:[id number]:fentaNYL, metoprolol  Assessment/Plan: POD#2 s/p I&D and trach. Continue IV abx. Consider repeat neck CT tomorrow. The patient is still unable to phonate with the cuff down, she likely has significant residual edema. Consider IV decadron. When the patient is able to phonate, please cap her trach in anticipation of decannulation.    LOS: 2 days   [name],SUI W 03/22/2014, 12:07 PM

## 2014-03-22 NOTE — Progress Notes (Signed)
PULMONARY / CRITICAL CARE MEDICINE   Name: Gabrielle Whitney MRN: [id number] DOB: 10/17/1933    ADMISSION DATE:  03/20/2014 CONSUElder LoveLTATION DATE:  03/20/2014  REFERRING MD :  [name]   CHIEF COMPLAINT:    INITIAL PRESENTATION:  [age] yo female with neck abscess causing upper airway obstruction.  Required emergent I&D and tracheostomy.  STUDIES:  3/12 CT neck >> swelling Lt glossal tonsillar sulcus, enhancing collection hyoid level  SIGNIFICANT EVENTS: 3/12 I&D neck abscess, tracheostomy placement  SUBJECTIVE:  No distress  VITAL SIGNS: Temp:  [98.2 F (36.8 C)-99.6 F (37.6 C)] 98.2 F (36.8 C) (03/14 0430) Pulse Rate:  [78-82] 82 (03/14 0430) Resp:  [16-20] 20 (03/14 0430) BP: (139-160)/(62-71) 143/62 mmHg (03/14 0430) SpO2:  [95 %-99 %] 99 % (03/14 0430) FiO2 (%):  [28 %] 28 % (03/14 0410) INTAKE / OUTPUT:  Intake/Output Summary (Last 24 hours) at 03/22/14 0847 Last data filed at 03/22/14 0802  Gross per 24 hour  Intake 2282.5 ml  Output   1875 ml  Net  407.5 ml    PHYSICAL EXAMINATION: General: no distress Neuro: normal strength HEENT: #6 trach site clean Cardiovascular: regular Lungs: no wheeze Abdomen: soft, non tender Musculoskeletal: no edema Skin: no rashes  LABS:  CBC  Recent Labs Lab 03/20/14 1635 03/21/14 0406 03/22/14 0444  WBC 14.1* 12.9* 10.7*  HGB 14.2 13.2 13.3  HCT 41.7 39.6 38.8  PLT 225 238 234   BMET  Recent Labs Lab 03/20/14 0746 03/20/14 1635 03/21/14 0406 03/22/14 0444  NA 132*  --  136 137  K 3.8  --  4.4 3.3*  CL 100  --  103 105  CO2 22  --  26 25  BUN 14  --  9 12  CREATININE 0.64 0.71 0.70 0.61  GLUCOSE 134*  --  148* 102*   Electrolytes  Recent Labs Lab 03/20/14 0746 03/21/14 0406 03/22/14 0444  CALCIUM 8.8 8.0* 8.0*  MG  --  1.9  --   PHOS  --  2.7  --    Glucose  Recent Labs Lab 03/20/14 2024 03/21/14 0803 03/21/14 1747 03/21/14 2146 03/22/14 0010 03/22/14 0426  GLUCAP 179* 141* 118* 98 110* 99     Imaging No results found.   ASSESSMENT / PLAN: Neck abscess cx 3/12 >> Blood cx 3/12 >>   Neck abscess s/p I&D 3/12. Tracheostomy placed by ENT 3/12. Plan: - day 3 of unasyn, cleocin - post-op/trach care per ENT  Hx of HTN. Plan: - hold outpt amlodipine, cozaar, lopressor until she can swallow pills  Dysphagia. Plan: - f/u with speech therapy when okay with ENT - continue IV fluid until able to take enteral nutrition  Hx of hypothyroidism Plan: - continue synthroid  Hx of Anxiety/Depression. Plan: - hold outpt zoloft, xanax until she can swallow pills  Updated family at bedside.  Will ask [facility] to assume care 3/15 and PCCM sign off.  Coralyn HellingVineet Tiffini Blacksher, MD [facility] 03/22/2014, 9:05 AM Pager:  [phone] After 3pm call: [phone]

## 2014-03-22 NOTE — Evaluation (Signed)
[facility] Speaking Valve - Evaluation Patient Details  Name: Gabrielle Whitney MRN: [id number] Date of Birth: 01/06/1934  Today's Date: 03/22/2014 Time: [id number] SLP Time Calculation (min) (ACUTE ONLY): 18 min  Past Medical History:  Past Medical History  Diagnosis Date  . Difficult intubation   . Medical history non-contributory    Past Surgical History: History reviewed. No pertinent past surgical history. HPI:  81 yo female [facility] with difficulty breathing and dysphagia x few days.  Pt found to have neck abscess and developed respiratory distress/stridor in ED requiring emergent trach tube placement for airway edema.  Per notes, pt with true cords midline and unremarkable,  severe erythematous larynx.    PMSV ordered.    Assessment / Plan / Recommendation Clinical Impression  PMSV trial commenced, currently pt unable to redirect airflow to upper airway nor phonate with trach occlusion- suspected due to ongoing edema.  Pt articulates well but this SLP is inadequate with lip reading skills and pt wrote for communication during session.  Provided pt with communication board to use as back up.  Educated pt/daughter to PMSV use, indications, contraindications.  With decreased pharyngeal/laryngeal edema, anticipate pt to tolerate PMSV well.  Will continue trials.        SLP Assessment  Patient needs continued Speech Language Pathology Services    Follow Up Recommendations  None    Frequency and Duration min 2x/week  2 weeks   Pertinent Vitals/Pain Afebrile, decreased    SLP Goals Potential to Achieve Goals (ACUTE ONLY): Good   PMSV Trial  PMSV was placed for: PMSV not placed due to lack of exhalation via upper airway - likely due to ongoing edema Able to redirect subglottic air through upper airway: No Able to Attain Phonation: No Able to Expectorate Secretions: No (but pt reports being able to expectorate secretions via trach intermittently, she states she was given a "bullet"  to help loosen them) Level of Secretion Expectoration with PMSV: Tracheal SpO2 During Trial: 98 % Pulse During Trial: 77   Tracheostomy Tube    [place] #6 Cuffed    Vent Dependency  FiO2 (%): 28 %    Cuff Deflation Trial Tolerated Cuff Deflation: Yes Behavior: Alert;Cooperative;Expresses self well;Good eye contact;Smiling Cuff Deflation Trial - Comments: pt tolerates cuff deflation but is unable to redirect air to upper airway with max cues   Donavan Burnetamara Charnese Federici, MS [facility] SLP [phone]

## 2014-03-22 NOTE — Care Management Note (Addendum)
    Page 1 of 1   03/25/2014     12:15:56 PM CARE MANAGEMENT NOTE 03/25/2014  Patient:  Gabrielle Whitney,Gabrielle Whitney   Account Number:  [id number]  Date Initiated:  03/22/2014  Documentation initiated by:  Lanier ClamMAHABIR,Dyanna Seiter  Subjective/Objective Assessment:   81 y/o f admitted w/neck abscess.     Action/Plan:   From home.   Anticipated DC Date:  03/25/2014   Anticipated DC Plan:  HOME/SELF CARE  In-house referral  PCP / [facility]      DC Planning Services  CM consult      Choice offered to / List presented to:  C-1 Patient           Status of service:  Completed, signed off Medicare Important Message given?  YES (If response is "NO", the following Medicare IM given date fields will be blank) Date Medicare IM given:  03/25/2014 Medicare IM given by:  [facility],Shaleen Talamantez Date Additional Medicare IM given:   Additional Medicare IM given by:    Discharge Disposition:  HOME/SELF CARE  Per UR Regulation:  Reviewed for med. necessity/level of care/duration of stay  If discussed at Long Length of Stay Meetings, dates discussed:   03/25/2014    Comments:  03/25/14 Lanier ClamKathy Louetta Hollingshead RN BSN NCM [phone] Trach removed.Provided w/PCP listing.No d/c needs.d/c home no needs or orders.  03/23/14 Lanier ClamKathy Keora Eccleston RN BSN NCM [phone] [facility] if [facility] needed.[facility]  [facility].  03/22/14 Lanier ClamKathy Hernan Turnage RN BSN NCM [phone] POD#2 neck I&D, &  emergent trach. Recommend HHRN. Will provide HHC agency list.Await final HHRN order.

## 2014-03-23 ENCOUNTER — Inpatient Hospital Stay (HOSPITAL_COMMUNITY): Payer: Commercial Managed Care - HMO

## 2014-03-23 ENCOUNTER — Encounter (HOSPITAL_COMMUNITY): Payer: Self-pay | Admitting: Internal Medicine

## 2014-03-23 DIAGNOSIS — R069 Unspecified abnormalities of breathing: Secondary | ICD-10-CM

## 2014-03-23 DIAGNOSIS — B950 Streptococcus, group A, as the cause of diseases classified elsewhere: Secondary | ICD-10-CM

## 2014-03-23 DIAGNOSIS — J384 Edema of larynx: Secondary | ICD-10-CM

## 2014-03-23 DIAGNOSIS — F329 Major depressive disorder, single episode, unspecified: Secondary | ICD-10-CM | POA: Diagnosis present

## 2014-03-23 DIAGNOSIS — Z930 Tracheostomy status: Secondary | ICD-10-CM

## 2014-03-23 DIAGNOSIS — E039 Hypothyroidism, unspecified: Secondary | ICD-10-CM

## 2014-03-23 DIAGNOSIS — J989 Respiratory disorder, unspecified: Secondary | ICD-10-CM

## 2014-03-23 DIAGNOSIS — I10 Essential (primary) hypertension: Secondary | ICD-10-CM

## 2014-03-23 DIAGNOSIS — F32A Depression, unspecified: Secondary | ICD-10-CM | POA: Diagnosis present

## 2014-03-23 DIAGNOSIS — L0211 Cutaneous abscess of neck: Secondary | ICD-10-CM

## 2014-03-23 DIAGNOSIS — R0989 Other specified symptoms and signs involving the circulatory and respiratory systems: Secondary | ICD-10-CM

## 2014-03-23 DIAGNOSIS — E876 Hypokalemia: Secondary | ICD-10-CM

## 2014-03-23 HISTORY — DX: Tracheostomy status: Z93.0

## 2014-03-23 HISTORY — DX: Edema of larynx: J38.4

## 2014-03-23 HISTORY — DX: Other specified symptoms and signs involving the circulatory and respiratory systems: R09.89

## 2014-03-23 HISTORY — DX: Streptococcus, group A, as the cause of diseases classified elsewhere: B95.0

## 2014-03-23 LAB — CULTURE, ROUTINE-ABSCESS

## 2014-03-23 MED ORDER — DEXAMETHASONE SODIUM PHOSPHATE 4 MG/ML IJ SOLN
4.0000 mg | Freq: Every day | INTRAMUSCULAR | Status: DC
  Administered 2014-03-23 – 2014-03-25 (×3): 4 mg via INTRAVENOUS
  Filled 2014-03-23 (×3): qty 1

## 2014-03-23 MED ORDER — KCL IN DEXTROSE-NACL 20-5-0.9 MEQ/L-%-% IV SOLN
INTRAVENOUS | Status: DC
  Administered 2014-03-23 – 2014-03-25 (×4): via INTRAVENOUS
  Filled 2014-03-23 (×6): qty 1000

## 2014-03-23 MED ORDER — POTASSIUM CHLORIDE IN NACL 20-0.9 MEQ/L-% IV SOLN: INTRAVENOUS | Status: DC

## 2014-03-23 MED ORDER — IOHEXOL 300 MG/ML  SOLN
100.0000 mL | Freq: Once | INTRAMUSCULAR | Status: AC | PRN
  Administered 2014-03-23: 100 mL via INTRAVENOUS

## 2014-03-23 NOTE — Progress Notes (Signed)
ANTIBIOTIC CONSULT NOTE - FOLLOW UP  Pharmacy Consult for unasyn Indication: Neck abscess  Allergies  Allergen Reactions  . Statins     Achy legs     Patient Measurements: Height: 5\' 2"  (157.5 cm) Weight: 162 lb 4.1 oz (73.6 kg) IBW/kg (Calculated) : 50.1   Vital Signs: Temp: 98 F (36.7 C) (03/15 0622) Temp Source: Oral (03/15 0622) BP: 155/79 mmHg (03/15 0622) Pulse Rate: 89 (03/15 0740) Intake/Output from previous day: 03/14 0701 - 03/15 0700 In: 2168.8 [I.V.:1868.8; IV Piggyback:300] Out: 2450 [Urine:2450] Intake/Output from this shift: Total I/O In: -  Out: 300 [Urine:300]  Labs:  Recent Labs  03/20/14 1635 03/21/14 0406 03/22/14 0444  WBC 14.1* 12.9* 10.7*  HGB 14.2 13.2 13.3  PLT 225 238 234  CREATININE 0.71 0.70 0.61   Estimated Creatinine Clearance: 54.4 mL/min (by C-G formula based on Cr of 0.61). No results for input(s): VANCOTROUGH, VANCOPEAK, VANCORANDOM, GENTTROUGH, GENTPEAK, GENTRANDOM, TOBRATROUGH, TOBRAPEAK, TOBRARND, AMIKACINPEAK, AMIKACINTROU, AMIKACIN in the last 72 hours.   Microbiology: Recent Results (from the past 720 hour(s))  Anaerobic culture     Status: None (Preliminary result)   Collection Time: 03/20/14 12:05 PM  Result Value Ref Range Status   Specimen Description [facility]ESS [facility] COLLAR/TRACH TUBE  Final   Special Requests NONE  Final   Gram Stain   Final    MODERATE WBC PRESENT, PREDOMINANTLY PMN NO SQUAMOUS EPITHELIAL CELLS SEEN ABUNDANT GRAM POSITIVE COCCI IN PAIRS IN CLUSTERS [facility]    Culture   Final    NO ANAEROBES ISOLATED; CULTURE IN PROGRESS FOR 5 DAYS [facility]formed at [facility]    Report Status PENDING  Incomplete  Culture, routine-abscess     Status: None   Collection Time: 03/20/14 12:05 PM  Result Value Ref Range Status   Specimen [facility]ACH [facility]UBE  Final   Special Requests NONE  Final   Gram Stain   Final    ABUNDANT WBC PRESENT, PREDOMINANTLY  PMN NO SQUAMOUS EPITHELIAL CELLS SEEN ABUNDANT GRAM POSITIVE COCCI IN PAIRS IN CHAINS [facility]formed at [facility]    Culture   Final    ABUNDANT GROUP A STREP (S.PYOGENES) ISOLATED Performed at [facility]    Report Status 03/23/2014 FINAL  Final  MRSA PCR Screening     Status: None   Collection Time: 03/20/14 12:50 PM  Result Value Ref Range Status   MRSA by PCR NEGATIVE NEGATIVE Final    Comment:        The GeneXpert MRSA Assay (FDA approved for NASAL specimens only), is one component of a comprehensive MRSA colonization surveillance program. It is not intended to diagnose MRSA infection nor to guide or monitor treatment for MRSA infections. [facility]   Culture, blood (routine x 2)     Status: None (Preliminary result)   Collection Time: 03/20/14  4:35 PM  Result Value Ref Range Status   Specimen Description BLOOD RIGHT ARM  Final   Special Requests   Final    BOTTLES DRAWN AEROBIC AND ANAEROBIC 10CC BLUE BOTTLE, 9CC RED BOTTLE   Culture   Final           BLOOD CULTURE RECEIVED NO GROWTH TO DATE CULTURE WILL BE HELD FOR 5 DAYS BEFORE ISSUING A FINAL NEGATIVE REPORT Performed at [facility]    Report Status PENDING  Incomplete  Culture, blood (routine x 2)     Status: None (Preliminary result)  Collection Time: 03/20/14  4:44 PM  Result Value Ref Range Status   Specimen Description BLOOD LEFT HAND  Final   Special Requests BOTTLES DRAWN AEROBIC ONLY 2CC BLUE BOTTLE  Final   Culture   Final           BLOOD CULTURE RECEIVED NO GROWTH TO DATE CULTURE WILL BE HELD FOR 5 DAYS BEFORE ISSUING A FINAL NEGATIVE REPORT Performed at [facility]    Report Status PENDING  Incomplete    Anti-infectives    Start     Dose/Rate Route Frequency Ordered Stop   03/20/14 1600  ampicillin-sulbactam (UNASYN) 1.5 g in sodium chloride 0.9 % 50 mL IVPB     1.5 g 100 mL/hr over 30 Minutes Intravenous Every 6 hours 03/20/14 1424      03/20/14 1415  clindamycin (CLEOCIN) IVPB 300 mg     300 mg 100 mL/hr over 30 Minutes Intravenous 3 times per day 03/20/14 1408     03/20/14 1045  clindamycin (CLEOCIN) IVPB 600 mg     600 mg 100 mL/hr over 30 Minutes Intravenous  Once 03/20/14 1030 03/20/14 1114   03/20/14 0930  ampicillin-sulbactam (UNASYN) 1.5 g in sodium chloride 0.9 % 50 mL IVPB     1.5 g 100 mL/hr over 30 Minutes Intravenous  Once 03/20/14 0921 03/20/14 1044      Assessment: 81 year old female admitted to [facility] on 3/12 w/ large left deep neck abscess and upper airway obstruction. Went to OR for emergent I&D w/ trach for airway on 3/12. Pharmacy is consulted to dose Unasyn.  3/12 >> Unasyn >> 3/12 >> Clinda >>  Tmax: Afebrile WBC: 10.7K, trending down Renal: SCr 0.61 - stable, CrCl 54 (N65 using 0.8)  3/12 neck abscess: Abundant GAS -pending 3/12 neck abscess (anaerobic): pending 3/12 bcx x2: ngtd  Goal of Therapy:  Eradication of infection  Plan:  - continue unasyn 1.5gm IV q6h  Kryslyn Helbig P 03/23/2014,10:42 AM

## 2014-03-23 NOTE — Progress Notes (Addendum)
Progress Note   Gabrielle Whitney ZOX:[id number] DOB: 09/17/1933 DOA: 03/20/2014 PCP: Marcelo BaldyMAUNEY, JESSICA S, PA-C   Brief Narrative:   Gabrielle Whitney is an 81 y.o. female with a PMH of hypertension, hypothyroidism and depression who was admitted by PCCM on 03/20/14 with chief complaint of a 2 day history of dysphasia and neck swelling. A CT scan of her neck showed a soft tissue abscess in the left neck. She acutely decompensated in the ED, and was emergently taken to the OR where she underwent a tracheostomy tube placement and drainage of the neck abscess. Cultures were positive for group A strep.  Assessment/Plan:   Principal Problem:   Neck abscess complicated by respiratory compromise from laryngeal edema status post tracheostomy - Status post I&D by Dr. Suszanne Connerseoh 03/20/14 with cultures positive for group A strep. - Blood cultures still pending. - Continue tracheostomy care per nursing staff. Speech therapy following.  If not decannulated soon, will need to address nutrition. Patient currently declines placement of a feeding tube intranasally. Add dextrose to IV fluids. - Repeat CT of the neck and add Decadron per ENT recommendations. - Dental consultation to evaluate whether infection related to dental issue.  Active Problems:   Hypokalemia - Add potassium to IV fluids.    Group A streptococcal infection - Continue Unasyn and clindamycin.    Essential hypertension - Norvasc, Cozaar and Lopressor on hold.    Hypothyroidism - Continue Synthroid.    Depression - Zoloft on hold.    DVT Prophylaxis - SCDs ordered.  Code Status: Full. Family Communication: Son updated at the bedside. Disposition Plan: Home when stable and trach decannulated with transition to oral medications/antibiotics.   IV Access:    Peripheral IV   Procedures and diagnostic studies:   Ct Soft Tissue Neck W Contrast 03/20/2014: 1. Extensive mucosal and submucosal soft tissue swelling on the left from the  glossal tonsillar sulcus through the piriform recess. 2. Low-density peripherally enhancing collection at the level the hyoid measures 14 x 9 x 7 mm, most compatible with a developing abscess. 3. This is most likely na inflammatory process although underlying neoplasm is also considered with significant mass effect in the left posterolateral oropharynx no significant adenopathy is present. 4. Asymmetric enlargement and peripheral stranding about the left submandibular gland with dilation of the central ducts but no visible obstruction within the left submandibular duct outside of the gland. This may be a secondary effect of the inflammation. 5. Moderate atherosclerotic calcifications without a definite stenosis. 6. Multilevel spondylosis in the cervical spine.      Medical Consultants:    Dr. Newman PiesSu Teoh, ENT  Dr. Coralyn HellingVineet Sood, PCCM  Anti-Infectives:    Unasyn 03/20/14--->  Clindamycin 03/20/14--->   Subjective:   Gabrielle Whitney has not had any oral intake since 03/19/14. No nausea or vomiting. States that the drainage coming out of her trach is more copious and thick today.  Objective:    Filed Vitals:   03/23/14 0740 03/23/14 1136 03/23/14 1250 03/23/14 1314  BP:    160/77  Pulse: 89 77  85  Temp:    98 F (36.7 C)  TempSrc:    Oral  Resp: 20 18  18   Height:      Weight:   72.666 kg (160 lb 3.2 oz)   SpO2: 100% 100%  100%    Intake/Output Summary (Last 24 hours) at 03/23/14 1505 Last data filed at 03/23/14 0836  Gross per 24 hour  Intake   1500 ml  Output   1750 ml  Net   -250 ml    Exam: Gen:  NAD Neck: Cervical lymphadenopathy with ecchymosis around trach site, trach draining thick gray mucus Cardiovascular:  RRR, No M/R/G Respiratory:  Lungs with scattered rhonchi Gastrointestinal:  Abdomen soft, NT/ND, + BS Extremities:  No C/E/C   Data Reviewed:    Labs: Basic Metabolic Panel:  Recent Labs Lab 03/20/14 0746 03/20/14 1635 03/21/14 0406 03/22/14 0444  NA  132*  --  136 137  K 3.8  --  4.4 3.3*  CL 100  --  103 105  CO2 22  --  26 25  GLUCOSE 134*  --  148* 102*  BUN 14  --  9 12  CREATININE 0.64 0.71 0.70 0.61  CALCIUM 8.8  --  8.0* 8.0*  MG  --   --  1.9  --   PHOS  --   --  2.7  --    GFR Estimated Creatinine Clearance: 54.1 mL/min (by C-G formula based on Cr of 0.61).  CBC:  Recent Labs Lab 03/20/14 0746 03/20/14 1635 03/21/14 0406 03/22/14 0444  WBC 12.5* 14.1* 12.9* 10.7*  HGB 14.5 14.2 13.2 13.3  HCT 41.6 41.7 39.6 38.8  MCV 91.8 91.9 92.3 91.9  PLT 204 225 238 234   CBG:  Recent Labs Lab 03/22/14 0010 03/22/14 0426 03/22/14 0751 03/22/14 1210 03/22/14 1722  GLUCAP 110* 99 94 113* 99    Microbiology Recent Results (from the past 240 hour(s))  Anaerobic culture     Status: None (Preliminary result)   Collection Time: 03/20/14 12:05 PM  Result Value Ref Range Status   Specimen Description ABSCESS [facility] COLLAR/TRACH TUBE  Final   Special Requests NONE  Final   Gram Stain   Final    MODERATE WBC PRESENT, PREDOMINANTLY PMN NO SQUAMOUS EPITHELIAL CELLS SEEN ABUNDANT GRAM POSITIVE COCCI IN PAIRS IN CLUSTERS Performed at [facility]    Culture   Final    NO ANAEROBES ISOLATED; CULTURE IN PROGRESS FOR 5 DAYS Performed at [facility]    Report Status PENDING  Incomplete  Culture, routine-abscess     Status: None   Collection Time: 03/20/14 12:05 PM  Result Value Ref Range Status   Specimen Description ABSCESS [facility] COLLAR/TRACH TUBE  Final   Special Requests NONE  Final   Gram Stain   Final    ABUNDANT WBC PRESENT, PREDOMINANTLY PMN NO SQUAMOUS EPITHELIAL CELLS SEEN ABUNDANT GRAM POSITIVE COCCI IN PAIRS IN CHAINS Performed at [facility]    Culture   Final    ABUNDANT GROUP A STREP (S.PYOGENES) ISOLATED Performed at [facility]    Report Status 03/23/2014 FINAL  Final  MRSA PCR Screening     Status: None   Collection Time: 03/20/14 12:50 PM  Result Value Ref  Range Status   MRSA by PCR NEGATIVE NEGATIVE Final    Comment:        The GeneXpert MRSA Assay (FDA approved for NASAL specimens only), is one component of a comprehensive MRSA colonization surveillance program. It is not intended to diagnose MRSA infection nor to guide or monitor treatment for MRSA infections. Performed at [facility]   Culture, blood (routine x 2)     Status: None (Preliminary result)   Collection Time: 03/20/14  4:35 PM  Result Value Ref Range Status   Specimen Description BLOOD RIGHT ARM  Final   Special Requests  Final    BOTTLES DRAWN AEROBIC AND ANAEROBIC 10CC BLUE BOTTLE, 9CC RED BOTTLE   Culture   Final           BLOOD CULTURE RECEIVED NO GROWTH TO DATE CULTURE WILL BE HELD FOR 5 DAYS BEFORE ISSUING A FINAL NEGATIVE REPORT Performed at [facility]    Report Status PENDING  Incomplete  Culture, blood (routine x 2)     Status: None (Preliminary result)   Collection Time: 03/20/14  4:44 PM  Result Value Ref Range Status   Specimen Description BLOOD LEFT HAND  Final   Special Requests BOTTLES DRAWN AEROBIC ONLY 2CC BLUE BOTTLE  Final   Culture   Final           BLOOD CULTURE RECEIVED NO GROWTH TO DATE CULTURE WILL BE HELD FOR 5 DAYS BEFORE ISSUING A FINAL NEGATIVE REPORT Performed at [facility]    Report Status PENDING  Incomplete     Medications:   . ampicillin-sulbactam (UNASYN) IV  1.5 g Intravenous Q6H  . clindamycin (CLEOCIN) IV  300 mg Intravenous 3 times per day  . dexamethasone  4 mg Intravenous Daily  . heparin  5,000 Units Subcutaneous 3 times per day  . levothyroxine  50 mcg Intravenous Daily   Continuous Infusions: . sodium chloride 75 mL/hr at 03/23/14 1009    Time spent: 35 minutes.  The patient is medically complex and requires high complexity decision making, coordination of care with specialists, and discussing with the patient and her son in detail plan of care.   LOS: 3 days    RAMA,CHRISTINA  [facility] Pager [phone]. If unable to reach me by pager, please call my cell phone at [phone].  *Please refer to [email or web address], password [email or web address] to get updated schedule on who will round on this patient, as hospitalists switch teams weekly. If 7PM-7AM, please contact night-coverage at [email or web address], password [email or web address] for any overnight needs.  03/23/2014, 3:05 PM

## 2014-03-23 NOTE — Consult Note (Signed)
DENTAL CONSULTATION  Date of Consultation:  03/23/2014 Patient Name:   Gabrielle Whitney Date of Birth:   11/19/1933 Medical Record Number: [id number]  VITALS: BP 160/77 mmHg  Pulse 80  Temp(Src) 98 F (36.7 C) (Oral)  Resp 16  Ht 5\' 2"  (1.575 m)  Wt 160 lb 3.2 oz (72.666 kg)  BMI 29.29 kg/m2  SpO2 100%  CHIEF COMPLAINT: Patient was referred by Dr. Darnelle Catalanama for a dental consultation.  HPI: Gabrielle Whitney is a 81 year old female recently admitted with a deep left neck abscess. Patient with acute upper airway distress that required emergent tracheostomy and incision and drainage by Dr. Suszanne Connerseoh. Abscess contents were cultured and initial results found a Group A strep infection with blood cultures currently pending.  Patient is currently on IV antibiotic therapy with Unasyn and clindamycin. Dental consultation was requested to rule out possible dental etiology for the deep left neck abscess.  Patient currently denies history of acute toothaches.  Patient initially presented to ED with a history of 2-3 days of sore throat and neck swelling. Patient does have a history of treatment in March 2015 for root canal therapy of the lower left premolar #21. This was performed by an endodontist, Dr. Nicholes RoughApplebaum, with no apparent postoperative complications. Patient receives her routine dental care with Dr. Chestine SporePatricia Thomas.  PROBLEM LIST: Patient Active Problem List   Diagnosis Date Noted  . Group A streptococcal infection 03/23/2014  . Essential hypertension 03/23/2014  . Hypothyroidism 03/23/2014  . Depression 03/23/2014  . Hypokalemia 03/23/2014  . Tracheostomy in place 03/23/2014  . Respiratory compromise 03/23/2014  . Laryngeal edema 03/23/2014  . Neck abscess 03/20/2014    PMH: Past Medical History  Diagnosis Date  . Difficult intubation   . Hypothyroidism   . Hypertension   . Depression     PSH: Past Surgical History  Procedure Laterality Date  . Tracheostomy tube placement N/A 03/20/2014     Procedure: AWAKE INTUBATION, TRACHEOSTOMY WITH DRAINAGE OF ABSCESS;  Surgeon: Newman PiesSu Teoh, MD;  Location: [facility];  Service: ENT;  Laterality: N/A;    ALLERGIES: Allergies  Allergen Reactions  . Statins     Achy legs     MEDICATIONS: Current Facility-Administered Medications  Medication Dose Route Frequency Provider Last Rate Last Dose  . ampicillin-sulbactam (UNASYN) 1.5 g in sodium chloride 0.9 % 50 mL IVPB  1.5 g Intravenous Q6H Rollene Farerin R Williamson, RPH   1.5 g at 03/23/14 0926  . clindamycin (CLEOCIN) IVPB 300 mg  300 mg Intravenous 3 times per day Simonne MartinetPeter E Babcock, NP   300 mg at 03/23/14 1528  . dexamethasone (DECADRON) injection 4 mg  4 mg Intravenous Daily Maryruth Bunhristina P Rama, MD   4 mg at 03/23/14 1213  . dextrose 5 % and 0.9 % NaCl with KCl 20 mEq/L infusion   Intravenous Continuous Christina P Rama, MD      . fentaNYL (SUBLIMAZE) injection 12.5-50 mcg  12.5-50 mcg Intravenous Q2H PRN Simonne MartinetPeter E Babcock, NP      . heparin injection 5,000 Units  5,000 Units Subcutaneous 3 times per day Simonne MartinetPeter E Babcock, NP   5,000 Units at 03/23/14 1530  . levothyroxine (SYNTHROID, LEVOTHROID) injection 50 mcg  50 mcg Intravenous Daily Simonne MartinetPeter E Babcock, NP   50 mcg at 03/23/14 0926  . LORazepam (ATIVAN) injection 1 mg  1 mg Intravenous Q6H PRN Coralyn HellingVineet Sood, MD   1 mg at 03/22/14 2234  . metoprolol (LOPRESSOR) injection 5 mg  5 mg  Intravenous Q4H PRN Oretha Milch, MD        LABS: Lab Results  Component Value Date   WBC 10.7* 03/22/2014   HGB 13.3 03/22/2014   HCT 38.8 03/22/2014   MCV 91.9 03/22/2014   PLT 234 03/22/2014      Component Value Date/Time   NA 137 03/22/2014 0444   K 3.3* 03/22/2014 0444   CL 105 03/22/2014 0444   CO2 25 03/22/2014 0444   GLUCOSE 102* 03/22/2014 0444   BUN 12 03/22/2014 0444   CREATININE 0.61 03/22/2014 0444   CALCIUM 8.0* 03/22/2014 0444   GFRNONAA 85* 03/22/2014 0444   GFRAA >90 03/22/2014 0444   No results found for: INR, PROTIME No results found for:  PTT  SOCIAL HISTORY: History   Social History  . Marital Status: Single    Spouse Name: N/A  . Number of Children: N/A  . Years of Education: N/A   Occupational History  . Not on file.   Social History Main Topics  . Smoking status: Never Smoker   . Smokeless tobacco: Not on file  . Alcohol Use: No  . Drug Use: No  . Sexual Activity: No   Other Topics Concern  . Not on file   Social History Narrative    FAMILY HISTORY: History reviewed. No pertinent family history.  REVIEW OF SYSTEMS: Reviewed from chart for this admission.  DENTAL HISTORY: CHIEF COMPLAINT: Patient was referred by Dr. Darnelle Catalan for a dental consultation.  HPI: Gabrielle Whitney is a 81 year old female recently admitted with a deep left neck abscess. Patient with acute upper airway distress that required emergent tracheostomy and incision and drainage by Dr. Suszanne Conners. Abscess contents were cultured and initial results found a Group A strep infection with blood cultures currently pending.  Patient is currently on IV antibiotic therapy with Unasyn and clindamycin. Dental consultation was requested to rule out possible dental etiology for the deep left neck abscess.  Patient currently denies history of acute toothaches.  Patient initially presented to ED with a history of 2-3 days of sore throat and neck swelling. Patient does have a history of treatment in March 2015 for root canal therapy of the lower left premolar #21. This was performed by an endodontist, Dr. Nicholes Rough, with no apparent postoperative complications. Patient receives her routine dental care with Dr. Chestine Spore.  DENTAL EXAMINATION: GENERAL: The patient is a well-developed, well-nourished female in no acute distress. Patient presents in wheelchair on oxygen to the trach site at 6L/minute. HEAD AND NECK: Left neck swelling noted. Previous left neck incision and drainage site noted. Tracheostomy is in place. INTRAORAL EXAM: Patient has normal saliva.  I do not see any oral abscess formation. Patient has good oral hygiene. DENTITION: The patient has multiple missing teeth-most of which have been replaced with dental crown and bridge restorations. PERIODONTAL: Patient with very good oral hygiene. DENTAL CARIES/SUBOPTIMAL RESTORATIONS: No obvious dental caries are suboptimal dental restorations are noted. Ideally patient would benefit from a full series of dental radiographs to rule out other incipient problems. ENDODONTIC: She currently denies acute pulpitis symptoms. Patient does have a history of root canal therapy of tooth #21 performed by Dr. Nicholes Rough in March 2015. The root canal fill looks acceptable. There is no apparent persistent periapical pathology or radiolucency. CROWN AND BRIDGE: Patient has multiple crown and bridge restorations that look acceptable. PROSTHODONTIC: No history of partial dentures. OCCLUSION: Patient has a stable occlusion at this time.  RADIOGRAPHIC INTERPRETATION: A orthopantogram  was taken of the department of dental medicine today. This is suboptimal secondary to patient movement. There are multiple missing teeth. Most of the teeth that are missing have been restored with crown or bridge restorations. There is incipient to moderate bone loss. There is a previous root canal therapy associated with tooth #21. There is no persistent periapical radiolucency noted. No other apparent periapical radiolucencies are noted. No gross dental caries are noted. Ideally patient would benefit from a full series of dental radiographs to rule out other possible dental etiology.  ASSESSMENTS: 1. Deep left neck abscess -status post incision and drainage by Dr. Suszanne Conners. 2. History of acute upper airway obstruction status post tracheostomy placement by Dr. Suszanne Conners 3. No obvious dental etiology for the deep left neck abscess.  PLAN/RECOMMENDATIONS: 1.  I discussed the lack of dental etiology for the deep left neck abscess with Dr. Darnelle Catalan at  this time as noted by my clinical exam and orthopantogram. 2.  Dr. Darnelle Catalan may consider ordering CT that includes mandible to assist in ruling out possible dental etiology for the deep left neck abscess. 3.  Continue IV antibiotic therapy per previous orders.  4.  Suggest follow-up with her general dentist for full series of dental radiographs to assist in identification of possible dental etiology once she is medically stable.   Charlynne Pander, DDS

## 2014-03-24 LAB — CLOSTRIDIUM DIFFICILE BY PCR: Toxigenic C. Difficile by PCR: NEGATIVE

## 2014-03-24 NOTE — Evaluation (Signed)
Clinical/Bedside Swallow Evaluation Patient Details  Name: Gabrielle Whitney MRN: [id number] Date of Birth: 1933/09/28  Today's Date: 03/24/2014 Time: SLP Start Time (ACUTE ONLY): 1549 SLP Stop Time (ACUTE ONLY): 1615 SLP Time Calculation (min) (ACUTE ONLY): 26 min  Past Medical History:  Past Medical History  Diagnosis Date  . Difficult intubation   . Hypothyroidism   . Hypertension   . Depression    Past Surgical History:  Past Surgical History  Procedure Laterality Date  . Tracheostomy tube placement N/A 03/20/2014    Procedure: AWAKE INTUBATION, TRACHEOSTOMY WITH DRAINAGE OF ABSCESS;  Surgeon: Newman Pies, MD;  Location: [facility];  Service: ENT;  Laterality: N/A;   HPI:  81 yo female adm to [facility] with difficulty breathing and dysphagia x few days.  Pt found to have neck abscess and developed respiratory distress/stridor in ED requiring emergent trach tube placement for airway edema.  Per notes, pt with true cords midline and unremarkable,  severe erythematous larynx.    PMSV ordered.    Assessment / Plan / Recommendation Clinical Impression  Pt presents with functional oropharyngeal swallow - There are no indications of airway compromise with intake nor residuals.  Swallow was timely and clear voice noted with pt occluding stoma.  Advised pt to gently cover stoma with intake to maximize subglottic air pressure and subsequently swallow safety.  Advised pt to currently avoid hot, acidic or "scratchy" items to prevent pharyngeal pain with swallow.  Pt denies any pain with intake and reports swallow be functional. All education completed re: s/s of aspiration, dysphagia and mitigation strategies.  SLP to sign off as dysphagia appears to have resolved with decreased edema and decannulation.     Aspiration Risk  Mild    Diet Recommendation Regular;Thin liquid   Liquid Administration via: Cup;Straw Medication Administration: Whole meds with liquid Supervision: Patient able to self  feed Compensations: Slow rate;Small sips/bites Postural Changes and/or Swallow Maneuvers: Seated upright 90 degrees;Upright 30-60 min after meal (hold stoma when swallowing )    Other  Recommendations Oral Care Recommendations: Oral care Q4 per protocol   Follow Up Recommendations  None    Frequency and Duration     n/a   Pertinent Vitals/Pain Afebrile, decreased     Swallow Study Prior Functional Status   see HHX    General Date of Onset: 03/24/14 HPI: 81 yo female adm to [facility] with difficulty breathing and dysphagia x few days.  Pt found to have neck abscess and developed respiratory distress/stridor in ED requiring emergent trach tube placement for airway edema.  Per notes, pt with true cords midline and unremarkable,  severe erythematous larynx.    PMSV ordered.  Type of Study: Bedside swallow evaluation Diet Prior to this Study: NPO Temperature Spikes Noted: No Respiratory Status: Room air History of Recent Intubation: No Behavior/Cognition: Alert;Cooperative;Pleasant mood Oral Cavity - Dentition: Adequate natural dentition Self-Feeding Abilities: Able to feed self Patient Positioning: Upright in bed Baseline Vocal Quality: Clear (with occulsion of stoma) Volitional Cough: Strong Volitional Swallow: Able to elicit    Oral/Motor/Sensory Function Overall Oral Motor/Sensory Function: Appears within functional limits for tasks assessed   Ice Chips Ice chips: Within functional limits Presentation: Self Fed;Spoon   Thin Liquid Thin Liquid: Within functional limits Presentation: Self Fed;Straw;Cup    Nectar Thick Nectar Thick Liquid: Not tested   Honey Thick Honey Thick Liquid: Not tested   Puree Puree: Within functional limits Presentation: Self Fed;Spoon   Solid   GO    Solid:  Within functional limits Presentation: Self Fed;Spoon       Gabrielle Burnetamara Harneet Noblett, MS [facility] SLP [phone]

## 2014-03-24 NOTE — Progress Notes (Signed)
Patient ID: Gabrielle Whitney, female   DOB: [birth year], 81 y.o.   MRN: [id number] [facility] PROGRESS NOTE  Gabrielle Whitney WUJ:[id number] DOB: July 16, 1933 DOA: 03/20/2014 PCP: Marcelo Baldy, PA-C  Brief narrative:    81 y.o. female with a PMH of hypertension, hypothyroidism and depression who was admitted by PCCM on 03/20/14 with chief complaint of a 2 day history of dysphasia and neck swelling. A CT scan of her neck showed a soft tissue abscess in the left neck. She acutely decompensated in the ED and was emergently taken to the OR where she underwent a tracheostomy tube placement and drainage of the neck abscess. Cultures were positive for group A strep.  Assessment/Plan:    Principal Problem: Neck abscess complicated by respiratory compromise from laryngeal edema status post tracheostomy - Status post I&D by Dr. Suszanne Conners 03/20/14 with cultures positive for group A strep. Blood cultures are negative so far. - Continue tracheostomy care.  - Speech therapy following. - Will start TPN today until she is able to have PO intake. She declined peg or intranasal tube for feeding. - Continue decadron 4 mg IV daily  Active Problems:  Hypokalemia - Potassium supplemented through IVF. Follow up BMP in am.   Group A streptococcal infection - Continue Unasyn and clindamycin.   Essential hypertension - BP 156/79Norvasc, Cozaar and Lopressor on hold. - Order placed for metoprolol 5 mg IV every 4 hours for BP control if needed   Hypothyroidism - Continue Synthroid, IV regimen   Depression - Zoloft on hold     DVT Prophylaxis  - SCD's bilaterally    Code Status: Full.  Family Communication:  plan of care discussed with the patient's daughter at the bedside Disposition Plan: Home when stable. Still unable to phonate  IV access:  Peripheral IV  Procedures and diagnostic studies:    Ct Soft Tissue Neck W Contrast 03/20/2014: 1. Extensive mucosal and submucosal soft tissue swelling on  the left from the glossal tonsillar sulcus through the piriform recess. 2. Low-density peripherally enhancing collection at the level the hyoid measures 14 x 9 x 7 mm, most compatible with a developing abscess. 3. This is most likely na inflammatory process although underlying neoplasm is also considered with significant mass effect in the left posterolateral oropharynx no significant adenopathy is present. 4. Asymmetric enlargement and peripheral stranding about the left submandibular gland with dilation of the central ducts but no visible obstruction within the left submandibular duct outside of the gland. This may be a secondary effect of the inflammation. 5. Moderate atherosclerotic calcifications without a definite stenosis. 6. Multilevel spondylosis in the cervical spine.    Medical Consultants:  Dr. Newman Pies, ENT Dr. Coralyn Helling, PCCM  Other Consultants:  SLP  IAnti-Infectives:   Unasyn 03/20/14---> Clindamycin 03/20/14--->   Manson Passey, MD  [facility] Pager [phone]  If 7PM-7AM, please contact night-coverage [email or web address] Password [email or web address] 03/24/2014, 3:08 PM   LOS: 4 days    HPI/Subjective: No acute overnight events.  Objective: Filed Vitals:   03/24/14 0458 03/24/14 1004 03/24/14 1120 03/24/14 1443  BP: 163/77   156/79  Pulse: 75 81 77 72  Temp: 97.7 F (36.5 C)   98 F (36.7 C)  TempSrc: Oral   Oral  Resp: Height:      Weight:      SpO2: 98% 98% 98% 99%    Intake/Output Summary (Last 24 hours) at 03/24/14 1508 Last data filed at 03/24/14 [phone]  Gross per 24 hour  Intake      0 ml  Output    800 ml  Net   -800 ml    Exam:   General:  Pt is alert, follows commands appropriately, not in acute distress; has trach in place  Cardiovascular: Regular rate and rhythm, S1/S2, no murmurs  Respiratory: Clear to auscultation bilaterally, no wheezing, no crackles, no rhonchi  Abdomen: Soft, non tender, non distended, bowel sounds  present  Extremities: No edema, pulses DP and PT palpable bilaterally  Neuro: Grossly nonfocal  Data Reviewed: Basic Metabolic Panel:  Recent Labs Lab 03/20/14 0746 03/20/14 1635 03/21/14 0406 03/22/14 0444  NA 132*  --  136 137  K 3.8  --  4.4 3.3*  CL 100  --  103 105  CO2 22  --  26 25  GLUCOSE 134*  --  148* 102*  BUN 14  --  9 12  CREATININE 0.64 0.71 0.70 0.61  CALCIUM 8.8  --  8.0* 8.0*  MG  --   --  1.9  --   PHOS  --   --  2.7  --    Liver Function Tests: No results for input(s): AST, ALT, ALKPHOS, BILITOT, PROT, ALBUMIN in the last 168 hours. No results for input(s): LIPASE, AMYLASE in the last 168 hours. No results for input(s): AMMONIA in the last 168 hours. CBC:  Recent Labs Lab 03/20/14 0746 03/20/14 1635 03/21/14 0406 03/22/14 0444  WBC 12.5* 14.1* 12.9* 10.7*  HGB 14.5 14.2 13.2 13.3  HCT 41.6 41.7 39.6 38.8  MCV 91.8 91.9 92.3 91.9  PLT 204 225 238 234   Cardiac Enzymes: No results for input(s): CKTOTAL, CKMB, CKMBINDEX, TROPONINI in the last 168 hours. BNP: Invalid input(s): POCBNP CBG:  Recent Labs Lab 03/22/14 0010 03/22/14 0426 03/22/14 0751 03/22/14 1210 03/22/14 1722  GLUCAP 110* 99 94 113* 99    Recent Results (from the past 240 hour(s))  Anaerobic culture     Status: None (Preliminary result)   Collection Time: 03/20/14 12:05 PM  Result Value Ref Range Status   Specimen Description ABSCESS [facility] COLLAR/TRACH TUBE  Final   Special Requests NONE  Final   Gram Stain   Final    MODERATE WBC PRESENT, PREDOMINANTLY PMN NO SQUAMOUS EPITHELIAL CELLS SEEN ABUNDANT GRAM POSITIVE COCCI IN PAIRS IN CLUSTERS Performed at [facility]    Culture   Final    NO ANAEROBES ISOLATED; CULTURE IN PROGRESS FOR 5 DAYS Performed at [facility]    Report Status PENDING  Incomplete  Culture, routine-abscess     Status: None   Collection Time: 03/20/14 12:05 PM  Result Value Ref Range Status   Specimen Description ABSCESS  [facility] COLLAR/TRACH TUBE  Final   Special Requests NONE  Final   Gram Stain   Final    ABUNDANT WBC PRESENT, PREDOMINANTLY PMN NO SQUAMOUS EPITHELIAL CELLS SEEN ABUNDANT GRAM POSITIVE COCCI IN PAIRS IN CHAINS Performed at [facility]    Culture   Final    ABUNDANT GROUP A STREP (S.PYOGENES) ISOLATED Performed at [facility]    Report Status 03/23/2014 FINAL  Final  MRSA PCR Screening     Status: None   Collection Time: 03/20/14 12:50 PM  Result Value Ref Range Status   MRSA by PCR NEGATIVE NEGATIVE Final  Culture, blood (routine x 2)     Status: None (Preliminary result)   Collection Time: 03/20/14  4:35 PM  Result Value  Ref Range Status   Specimen Description BLOOD RIGHT ARM  Final   Special Requests   Final    BOTTLES DRAWN AEROBIC AND ANAEROBIC 10CC BLUE BOTTLE, 9CC RED BOTTLE   Culture   Final           BLOOD CULTURE RECEIVED NO GROWTH TO DATE CULTURE WILL BE HELD FOR 5 DAYS BEFORE ISSUING A FINAL NEGATIVE REPORT [facility]    Report Status PENDING  Incomplete  Culture, blood (routine x 2)     Status: None (Preliminary result)   Collection Time: 03/20/14  4:44 PM  Result Value Ref Range Status   Specimen Description BLOOD LEFT HAND  Final   Special Requests BOTTLES DRAWN AEROBIC ONLY 2CC BLUE BOTTLE  Final   Culture   Final           BLOOD CULTURE RECEIVED NO GROWTH TO DATE CULTURE WILL BE HELD FOR 5 DAYS BEFORE ISSUING A FINAL NEGATIVE REPORT [facility] at [facility]    Report Status PENDING  Incomplete  Clostridium Difficile by PCR     Status: None   Collection Time: 03/24/14  4:49 AM  Result Value Ref Range Status   C difficile by pcr NEGATIVE NEGATIVE Final     Scheduled Meds: . ampicillin-sulbactam (UNASYN) IV  1.5 g Intravenous Q6H  . clindamycin (CLEOCIN) IV  300 mg Intravenous 3 times per day  . dexamethasone  4 mg Intravenous Daily  . heparin  5,000 Units Subcutaneous 3 times per day  . levothyroxine  50  mcg Intravenous Daily   Continuous Infusions: . dextrose 5 % and 0.9 % NaCl with KCl 20 mEq/L 100 mL/hr at 03/24/14 1049

## 2014-03-24 NOTE — Progress Notes (Addendum)
SLP Cancellation Note  Patient Details Name: Gabrielle Whitney MRN: [id number] DOB: 12/15/1933   Cancelled treatment:       Reason Eval/Treat Not Completed:  (pt unable to move air through upper airway with cuff deflation with max cues, ? if due to size of trach #8 and ongoing edema.  Note improvement with edema per CT.    ENT could trach be downsized?  Also please clarify do you desire for pt to have PMSV or capping when able to voice.  SLP to continue to follow.  Thanks.    Donavan Burnetamara Trampas Stettner, MS [facility] SLP [phone]

## 2014-03-24 NOTE — Progress Notes (Signed)
CT shows significant improvement in edema/abscess.  Trach removed without difficulty.  No bleeding.  Please have speech re-evaluate pt and advance diet as tolerated.

## 2014-03-24 NOTE — Progress Notes (Signed)
Patient decannulated by Dr. Janeece RiggersSu at 12:20pm today. Patient is doing very well. Able to voice clearly, vital signs stable on room air. Cleared by Speech Therapy for regular diet.

## 2014-03-24 NOTE — Progress Notes (Signed)
Dr. Suszanne Connerseoh has removed trach at this time. Clean, dry dressing applied to neck by MD.

## 2014-03-25 DIAGNOSIS — F329 Major depressive disorder, single episode, unspecified: Secondary | ICD-10-CM

## 2014-03-25 LAB — ANAEROBIC CULTURE

## 2014-03-25 LAB — BASIC METABOLIC PANEL
Anion gap: 7 (ref 5–15)
BUN: 10 mg/dL (ref 6–23)
CHLORIDE: 105 mmol/L (ref 96–112)
CO2: 22 mmol/L (ref 19–32)
Calcium: 7.8 mg/dL — ABNORMAL LOW (ref 8.4–10.5)
Creatinine, Ser: 0.56 mg/dL (ref 0.50–1.10)
GFR calc non Af Amer: 87 mL/min — ABNORMAL LOW (ref >90)
GLUCOSE: 111 mg/dL — AB (ref 70–99)
Potassium: 3.2 mmol/L — ABNORMAL LOW (ref 3.5–5.1)
SODIUM: 134 mmol/L — AB (ref 135–145)

## 2014-03-25 MED ORDER — POTASSIUM CHLORIDE CRYS ER 20 MEQ PO TBCR
40.0000 meq | EXTENDED_RELEASE_TABLET | Freq: Once | ORAL | Status: AC
  Administered 2014-03-25: 40 meq via ORAL
  Filled 2014-03-25: qty 2

## 2014-03-25 MED ORDER — DEXAMETHASONE 2 MG PO TABS: 2.0000 mg | ORAL_TABLET | Freq: Every day | ORAL | Status: AC

## 2014-03-25 MED ORDER — POTASSIUM CHLORIDE 10 MEQ/100ML IV SOLN: 10.0000 meq | INTRAVENOUS | Status: DC

## 2014-03-25 MED ORDER — LORAZEPAM 1 MG PO TABS: 1.0000 mg | ORAL_TABLET | Freq: Two times a day (BID) | ORAL | Status: DC | PRN

## 2014-03-25 MED ORDER — CLINDAMYCIN HCL 150 MG PO CAPS: 150.0000 mg | ORAL_CAPSULE | Freq: Three times a day (TID) | ORAL | Status: DC

## 2014-03-25 MED ORDER — POTASSIUM CHLORIDE ER 20 MEQ PO TBCR: 20.0000 meq | EXTENDED_RELEASE_TABLET | Freq: Every day | ORAL | Status: DC

## 2014-03-25 NOTE — Progress Notes (Signed)
I completed the D/C teaching with the patient and family and answered all questions. Pt is being D/C home via family and is in stable condition.

## 2014-03-25 NOTE — Discharge Instructions (Signed)

## 2014-03-25 NOTE — Discharge Summary (Signed)
Physician Discharge Summary  Gabrielle Whitney WUJ:[id number] DOB: [birth year] DOA: 03/20/2014  PCP: Marcelo Baldy, PA-C  Admit date: 03/20/2014 Discharge date: 03/25/2014  Recommendations for Outpatient Follow-up:  1. Please take clindamycin as prescribed for 7 days on discharge. 2. Please take dexamethasone 2 mg daily for 5 more days on discharge. 3. Please follow-up with primary care physician per scheduled appointment. 4. Potassium level is 3.2 prior to discharge. It was supplemented prior to discharge. Continue to take potassium supplementation 10 mEq daily for 5 more days on discharge. Have your primary care physician recheck potassium level during next visit.  Discharge Diagnoses:  Principal Problem:   Neck abscess Active Problems:   Group A streptococcal infection   Essential hypertension   Hypothyroidism   Depression   Hypokalemia   Tracheostomy in place   Respiratory compromise   Laryngeal edema    Discharge Condition: stable   Diet recommendation: as tolerated   History of present illness:  81 y.o. female with a PMH of hypertension, hypothyroidism and depression who was admitted by PCCM on 03/20/14 with chief complaint of a 2 day history of dysphasia and neck swelling. A CT scan of her neck showed a soft tissue abscess in the left neck. She acutely decompensated in the ED and was emergently taken to the OR where she underwent a tracheostomy tube placement and drainage of the neck abscess. Cultures were positive for group A strep.  Assessment/Plan:    Principal Problem: Neck abscess complicated by respiratory compromise from laryngeal edema status post tracheostomy - Status post I&D by Dr. Suszanne Conners 03/20/14 with cultures positive for group A strep. Blood cultures are negative so far. - Trach removed 03/24/2014 by ENT. - Patient was reevaluated by speech and swallow therapist. Diet advanced as tolerated. - Patient will continue Decadron 2 mg daily for 5 more days on  discharge. - Patient will continue taking clindamycin for 7 days on discharge.  Active Problems:  Hypokalemia - Potassium supplemented through IVF. Potassium this morning 3.2 and it was supplemented prior to discharge. Prescription provided for potassium supplementation for 5 days on discharge.    Group A streptococcal infection - Continue clindamycin on discharge.   Essential hypertension - resume medications medications.    Hypothyroidism - Continue Synthroid   Depression - continue Zoloft      DVT Prophylaxis  - SCD's bilaterally    Code Status: Full.  Family Communication: plan of care discussed with the patient's daughter at the bedside   IV access:  Peripheral IV  Procedures and diagnostic studies:   Ct Soft Tissue Neck W Contrast 03/20/2014: 1. Extensive mucosal and submucosal soft tissue swelling on the left from the glossal tonsillar sulcus through the piriform recess. 2. Low-density peripherally enhancing collection at the level the hyoid measures 14 x 9 x 7 mm, most compatible with a developing abscess. 3. This is most likely na inflammatory process although underlying neoplasm is also considered with significant mass effect in the left posterolateral oropharynx no significant adenopathy is present. 4. Asymmetric enlargement and peripheral stranding about the left submandibular gland with dilation of the central ducts but no visible obstruction within the left submandibular duct outside of the gland. This may be a secondary effect of the inflammation. 5. Moderate atherosclerotic calcifications without a definite stenosis. 6. Multilevel spondylosis in the cervical spine.    Medical Consultants:  Dr. Newman Pies, ENT Dr. Coralyn Helling, PCCM  Other Consultants:  SLP IAnti-Infectives:   Unasyn 03/20/14---> 03/25/2014  Clindamycin 03/20/14--->  for 7 more days on discharge    Signed:  Manson Passey, MD  [facility] 03/25/2014, 9:17  AM  Pager #: [phone]   Discharge Exam: Filed Vitals:   03/25/14 0422  BP: 153/85  Pulse: 74  Temp: 97.9 F (36.6 C)  Resp: 18   Filed Vitals:   03/25/14 0004 03/25/14 0419 03/25/14 0422 03/25/14 0625  BP:   153/85   Pulse: 69 71 74   Temp:   97.9 F (36.6 C)   TempSrc:   Oral   Resp: 14 19 18    Height:      Weight:    72.394 kg (159 lb 9.6 oz)  SpO2: 99% 97% 99%     General: Pt is alert, follows commands appropriately, not in acute distress Cardiovascular: Regular rate and rhythm, S1/S2 +, no murmurs Respiratory: Clear to auscultation bilaterally, no wheezing, no crackles, no rhonchi Abdominal: Soft, non tender, non distended, bowel sounds +, no guarding Extremities: no edema, no cyanosis, pulses palpable bilaterally DP and PT Neuro: Grossly nonfocal  Discharge Instructions  Discharge Instructions    Call MD for:  difficulty breathing, headache or visual disturbances    Complete by:  As directed      Call MD for:  persistant nausea and vomiting    Complete by:  As directed      Call MD for:  severe uncontrolled pain    Complete by:  As directed      Diet - low sodium heart healthy    Complete by:  As directed      Discharge instructions    Complete by:  As directed   1. Please take clindamycin as prescribed for 7 days on discharge. 2. Please take dexamethasone 2 mg daily for 5 more days on discharge. 3. Please follow-up with primary care physician per scheduled appointment. 4. Potassium level is 3.2 prior to discharge. It was supplemented prior to discharge. Continue to take potassium supplementation 10 mEq daily for 5 more days on discharge. Have your primary care physician recheck potassium level during next visit.     Increase activity slowly    Complete by:  As directed             Medication List    TAKE these medications        ALPRAZolam 0.5 MG tablet  Commonly known as:  XANAX  Take 0.25-0.5 mg by mouth at bedtime as needed for anxiety.      amLODipine 5 MG tablet  Commonly known as:  NORVASC  Take 5 mg by mouth every morning.     clindamycin 150 MG capsule  Commonly known as:  CLEOCIN  Take 1 capsule (150 mg total) by mouth 3 (three) times daily.     dexamethasone 2 MG tablet  Commonly known as:  DECADRON  Take 1 tablet (2 mg total) by mouth daily.     levothyroxine 100 MCG tablet  Commonly known as:  SYNTHROID, LEVOTHROID  Take 100 mcg by mouth daily before breakfast.     losartan 50 MG tablet  Commonly known as:  COZAAR  Take 50 mg by mouth daily.     metoprolol 50 MG tablet  Commonly known as:  LOPRESSOR  Take 50 mg by mouth every morning.     Potassium Chloride ER 20 MEQ Tbcr  Take 20 mEq by mouth daily.     sertraline 100 MG tablet  Commonly known as:  ZOLOFT  Take 100 mg by mouth every morning.  Follow-up Information    Follow up with MAUNEY, JESSICA S, PA-C. Schedule an appointment as soon as possible for a visit in 1 week.   Specialty:  Physician Assistant   Why:  Follow up appt after recent hospitalization   Contact information:   [address] [place] [address] [phone]        The results of significant diagnostics from this hospitalization (including imaging, microbiology, ancillary and laboratory) are listed below for reference.    Significant Diagnostic Studies: Ct Soft Tissue Neck W Contrast  03/23/2014   CLINICAL DATA:  81 year old female status post surgery for left neck abscess 3 days ago. Acute upper airway distress status post emergent tracheostomy. Subsequent encounter.  EXAM: CT NECK WITH CONTRAST  TECHNIQUE: Multidetector CT imaging of the neck was performed using the standard protocol following the bolus administration of intravenous contrast.  CONTRAST:  OMNIPAQUE IOHEXOL 300 MG/ML  SOLN  COMPARISON:  CT neck 03/20/2014.  FINDINGS: Pharynx and larynx: Tracheostomy tube now in place. Regressed inflammation throughout the left pharynx,  parapharyngeal spaces, retropharyngeal space, involving the larynx, and left strap muscles. Tiny residual fluid collection with surrounding enhancement measuring 6-7 mm on series 4, image 34. No drainable collection remains. Decreased subcutaneous stranding. Mild postoperative change or residual phlegmon anterior to the thyroid cartilage on the left (series 4, image 47). Superior parapharyngeal spaces remain normal. Negative sublingual space aside from unchanged 2-3 mm sialolith on the right.  Salivary glands: Negative; the left submandibular gland no longer appears inflamed. Right sublingual space sialolith unchanged.  Thyroid: Negative status post tracheostomy.  Lymph nodes: Regressed level to lymphadenopathy. No new or increased lymph nodes.  Vascular: Stable and patent. Bilateral carotid calcified plaque re- identified.  Limited intracranial: Negative.  Visualized orbits: Not included.  Mastoids and visualized paranasal sinuses: Negative.  Skeleton: Degenerative changes. No acute osseous abnormality identified.  Upper chest: Visualize trachea is patent. Mildly increased dependent atelectasis, mildly increased dependent atelectasis. Subtle ground-glass opacity now in the left upper lobe on series 6, image 24. No superior mediastinal lymphadenopathy.  IMPRESSION: 1. Postoperative changes with positive response to treatment since 03/20/2014. Regressed transspatial infection and neck abscess. No residual drainable fluid collection. Regressed lymphadenopathy. 2. Subtle ground-glass opacity now on the left upper lobe. Developing respiratory infection difficult to exclude.   Electronically Signed   By: Odessa Fleming M.D.   On: 03/23/2014 21:47   Ct Soft Tissue Neck W Contrast  03/20/2014   CLINICAL DATA:  Left-sided neck swelling. Tenderness in the left neck. Pain with swallowing.  EXAM: CT NECK WITH CONTRAST  TECHNIQUE: Multidetector CT imaging of the neck was performed using the standard protocol following the bolus  administration of intravenous contrast.  CONTRAST:  OMNIPAQUE IOHEXOL 300 MG/ML  SOLN  COMPARISON:  None.  FINDINGS: Pharynx and larynx: Extensive mucosal and submucosal soft tissue swelling is present in the left neck from the level of the glossal tonsillar sulcus inferiorly to the piriform recess. A low-density peripherally enhancing collection at the level of the hyoid measures 14 x 9 x 7 mm, compatible with a developing abscess. The there is asymmetric soft tissue swelling within the left vallecula and marked mass effect along the a left posterolateral oropharynx. The tongue base is otherwise unremarkable vocal cord scratch the there is some edema extending into the left false vocal cord. The true vocal cords are unremarkable and midline.  Inflammatory changes extend through the strap muscles on the left into the subcutaneous soft tissues.  Salivary glands: Ductal dilation and asymmetric enlargement is noted in the left submandibular gland. No obstructing stone is visible. The right submandibular gland and bilateral parotid glands are within normal limits.  Thyroid: Negative  Lymph nodes: Prominent jugulodigastric lymph nodes are evident bilaterally. No other significant adenopathy is present.  Vascular: Atherosclerotic calcifications are present in the the aorta at the origins the great vessels without significant stenosis. There is moderate tortuosity of the common carotid arteries bilaterally. Dense atherosclerotic calcifications are present at the carotid bifurcations bilaterally. There is no definite stenosis.  Limited intracranial: Within normal limits  Visualized orbits: Negative  Mastoids and visualized paranasal sinuses: The left sphenoid sinus is partially opacified. The visualized paranasal sinuses and the mastoid air cells are otherwise clear.  Skeleton: Degenerative changes are present throughout the cervical spine. There is fusion across the disc space at C3-4. Posterior elements are fused as  well. Endplate degenerative change and facet disease is noted bilaterally at C4-5 with slight anterolisthesis. No focal lytic or blastic lesions are evident.  Upper chest: The lung apices are clear.  IMPRESSION: 1. Extensive mucosal and submucosal soft tissue swelling on the left from the glossal tonsillar sulcus through the piriform recess. 2. Low-density peripherally enhancing collection at the level the hyoid measures 14 x 9 x 7 mm, most compatible with a developing abscess. 3. This is most likely na inflammatory process although underlying neoplasm is also considered with significant mass effect in the left posterolateral oropharynx no significant adenopathy is present. 4. Asymmetric enlargement and peripheral stranding about the left submandibular gland with dilation of the central ducts but no visible obstruction within the left submandibular duct outside of the gland. This may be a secondary effect of the inflammation. 5. Moderate atherosclerotic calcifications without a definite stenosis. 6. Multilevel spondylosis in the cervical spine.   Electronically Signed   By: Marin Robertshristopher  Mattern M.D.   On: 03/20/2014 09:27    Microbiology: Recent Results (from the past 240 hour(s))  Anaerobic culture     Status: None (Preliminary result)   Collection Time: 03/20/14 12:05 PM  Result Value Ref Range Status   Specimen Description [facility]  Final   Special Requests NONE  Final   Gram Stain   Final    MODERATE WBC PRESENT, PREDOMINANTLY PMN NO SQUAMOUS EPITHELIAL CELLS SEEN ABUNDANT GRAM POSITIVE COCCI IN PAIRS IN CLUSTERS    Culture   Final    NO ANAEROBES ISOLATED; CULTURE IN PROGRESS FOR 5 DAYS [facility]    Report Status PENDING  Incomplete  Culture, routine-abscess     Status: None   Collection Time: 03/20/14 12:05 PM  Result Value Ref Range Status   Spec[facility]  Final   Special Requests NONE  Final   Gram  Stain   Final    ABUNDANT WBC PRESENT, PREDOMINANTLY PMN NO SQUAMOUS EPITHELIAL CELLS SEEN ABUNDANT GRAM POSITIVE COCCI IN PAIRS IN CHAINS   Culture   Final    ABUNDANT GROUP A STREP (S.PYOGENES) ISOLATED [facility]formed at [facility]    Report Status 03/23/2014 FINAL  Final  MRSA PCR Screening     Status: None   Collection Time: 03/20/14 12:50 PM  Result Value Ref Range Status   MRSA by PCR NEGATIVE NEGATIVE Final  Culture, blood (routine x 2)     Status: None (Preliminary result)   Collection Time: 03/20/14  4:35 PM  Result Value Ref Range Status   Specimen Description  BLOOD RIGHT ARM  Final   Special Requests   Final    BOTTLES DRAWN AEROBIC AND ANAEROBIC 10CC BLUE BOTTLE, 9CC RED BOTTLE   Culture   Final           BLOOD CULTURE RECEIVED NO GROWTH TO DATE CULTURE WILL BE HELD FOR 5 DAYS BEFORE ISSUING A FINAL NEGATIVE REPORT Performed at [facility]    Report Status PENDING  Incomplete  Culture, blood (routine x 2)     Status: None (Preliminary result)   Collection Time: 03/20/14  4:44 PM  Result Value Ref Range Status   Specimen Description BLOOD LEFT HAND  Final   Special Requests BOTTLES DRAWN AEROBIC ONLY 2CC BLUE BOTTLE  Final   Culture   Final           BLOOD CULTURE RECEIVED NO GROWTH TO DATE CULTURE WILL BE HELD FOR 5 DAYS BEFORE ISSUING A FINAL NEGATIVE REPORT Performed at [facility]    Report Status PENDING  Incomplete  Clostridium Difficile by PCR     Status: None   Collection Time: 03/24/14  4:49 AM  Result Value Ref Range Status   C difficile by pcr NEGATIVE NEGATIVE Final     Labs: Basic Metabolic Panel:  Recent Labs Lab 03/20/14 0746 03/20/14 1635 03/21/14 0406 03/22/14 0444 03/25/14 0437  NA 132*  --  136 137 134*  K 3.8  --  4.4 3.3* 3.2*  CL 100  --  103 105 105  CO2 22  --  GLUCOSE 134*  --  148* 102* 111*  BUN 14  --  CREATININE 0.64 0.71 0.70 0.61 0.56  CALCIUM 8.8  --  8.0* 8.0* 7.8*  MG  --    --  1.9  --   --   PHOS  --   --  2.7  --   --    Liver Function Tests: No results for input(s): AST, ALT, ALKPHOS, BILITOT, PROT, ALBUMIN in the last 168 hours. No results for input(s): LIPASE, AMYLASE in the last 168 hours. No results for input(s): AMMONIA in the last 168 hours. CBC:  Recent Labs Lab 03/20/14 0746 03/20/14 1635 03/21/14 0406 03/22/14 0444  WBC 12.5* 14.1* 12.9* 10.7*  HGB 14.5 14.2 13.2 13.3  HCT 41.6 41.7 39.6 38.8  MCV 91.8 91.9 92.3 91.9  PLT 204 225 238 234   Cardiac Enzymes: No results for input(s): CKTOTAL, CKMB, CKMBINDEX, TROPONINI in the last 168 hours. BNP: BNP (last 3 results) No results for input(s): BNP in the last 8760 hours.  ProBNP (last 3 results) No results for input(s): PROBNP in the last 8760 hours.  CBG:  Recent Labs Lab 03/22/14 0010 03/22/14 0426 03/22/14 0751 03/22/14 1210 03/22/14 1722  GLUCAP 110* 99 94 113* 99    Time coordinating discharge: Over 30 minutes

## 2014-03-26 LAB — CULTURE, BLOOD (ROUTINE X 2)
CULTURE: NO GROWTH
CULTURE: NO GROWTH

## 2014-03-31 DIAGNOSIS — R490 Dysphonia: Secondary | ICD-10-CM | POA: Diagnosis not present

## 2014-04-01 DIAGNOSIS — E876 Hypokalemia: Secondary | ICD-10-CM | POA: Diagnosis not present

## 2014-04-01 DIAGNOSIS — R7989 Other specified abnormal findings of blood chemistry: Secondary | ICD-10-CM | POA: Diagnosis not present

## 2014-04-01 DIAGNOSIS — E782 Mixed hyperlipidemia: Secondary | ICD-10-CM | POA: Diagnosis not present

## 2014-04-01 DIAGNOSIS — Z1211 Encounter for screening for malignant neoplasm of colon: Secondary | ICD-10-CM | POA: Diagnosis not present

## 2014-04-01 DIAGNOSIS — F418 Other specified anxiety disorders: Secondary | ICD-10-CM | POA: Diagnosis not present

## 2014-04-01 DIAGNOSIS — Z9889 Other specified postprocedural states: Secondary | ICD-10-CM | POA: Diagnosis not present

## 2014-04-01 DIAGNOSIS — G4701 Insomnia due to medical condition: Secondary | ICD-10-CM | POA: Diagnosis not present

## 2014-04-09 DIAGNOSIS — G4733 Obstructive sleep apnea (adult) (pediatric): Secondary | ICD-10-CM | POA: Diagnosis not present

## 2014-04-15 DIAGNOSIS — J029 Acute pharyngitis, unspecified: Secondary | ICD-10-CM | POA: Diagnosis not present

## 2014-04-15 DIAGNOSIS — Z9889 Other specified postprocedural states: Secondary | ICD-10-CM | POA: Diagnosis not present

## 2014-05-03 DIAGNOSIS — I10 Essential (primary) hypertension: Secondary | ICD-10-CM | POA: Diagnosis not present

## 2014-05-03 DIAGNOSIS — M25559 Pain in unspecified hip: Secondary | ICD-10-CM | POA: Diagnosis not present

## 2014-05-03 DIAGNOSIS — G4700 Insomnia, unspecified: Secondary | ICD-10-CM | POA: Diagnosis not present

## 2014-05-03 DIAGNOSIS — Z9889 Other specified postprocedural states: Secondary | ICD-10-CM | POA: Diagnosis not present

## 2014-05-03 DIAGNOSIS — J309 Allergic rhinitis, unspecified: Secondary | ICD-10-CM | POA: Diagnosis not present

## 2014-05-12 DIAGNOSIS — G4733 Obstructive sleep apnea (adult) (pediatric): Secondary | ICD-10-CM | POA: Diagnosis not present

## 2014-05-17 DIAGNOSIS — M7072 Other bursitis of hip, left hip: Secondary | ICD-10-CM | POA: Diagnosis not present

## 2014-05-17 DIAGNOSIS — M7071 Other bursitis of hip, right hip: Secondary | ICD-10-CM | POA: Diagnosis not present

## 2014-05-19 DIAGNOSIS — G4733 Obstructive sleep apnea (adult) (pediatric): Secondary | ICD-10-CM | POA: Diagnosis not present

## 2014-06-09 DIAGNOSIS — G4733 Obstructive sleep apnea (adult) (pediatric): Secondary | ICD-10-CM | POA: Diagnosis not present

## 2014-06-12 DIAGNOSIS — G4733 Obstructive sleep apnea (adult) (pediatric): Secondary | ICD-10-CM | POA: Diagnosis not present

## 2014-06-30 DIAGNOSIS — R490 Dysphonia: Secondary | ICD-10-CM | POA: Diagnosis not present

## 2014-07-22 DIAGNOSIS — O98011 Tuberculosis complicating pregnancy, first trimester: Secondary | ICD-10-CM | POA: Diagnosis not present

## 2014-07-22 DIAGNOSIS — G4733 Obstructive sleep apnea (adult) (pediatric): Secondary | ICD-10-CM | POA: Diagnosis not present

## 2014-11-09 DIAGNOSIS — E782 Mixed hyperlipidemia: Secondary | ICD-10-CM | POA: Diagnosis not present

## 2014-11-09 DIAGNOSIS — Z23 Encounter for immunization: Secondary | ICD-10-CM | POA: Diagnosis not present

## 2014-11-09 DIAGNOSIS — M47816 Spondylosis without myelopathy or radiculopathy, lumbar region: Secondary | ICD-10-CM | POA: Diagnosis not present

## 2014-11-09 DIAGNOSIS — M25569 Pain in unspecified knee: Secondary | ICD-10-CM | POA: Diagnosis not present

## 2014-11-09 DIAGNOSIS — G479 Sleep disorder, unspecified: Secondary | ICD-10-CM | POA: Diagnosis not present

## 2014-11-09 DIAGNOSIS — Z79899 Other long term (current) drug therapy: Secondary | ICD-10-CM | POA: Diagnosis not present

## 2014-11-09 DIAGNOSIS — M9420 Chondromalacia, unspecified site: Secondary | ICD-10-CM | POA: Diagnosis not present

## 2014-11-09 DIAGNOSIS — E039 Hypothyroidism, unspecified: Secondary | ICD-10-CM | POA: Diagnosis not present

## 2014-11-29 DIAGNOSIS — M7071 Other bursitis of hip, right hip: Secondary | ICD-10-CM | POA: Diagnosis not present

## 2014-12-06 DIAGNOSIS — M25552 Pain in left hip: Secondary | ICD-10-CM | POA: Diagnosis not present

## 2014-12-06 DIAGNOSIS — M545 Low back pain: Secondary | ICD-10-CM | POA: Diagnosis not present

## 2014-12-06 DIAGNOSIS — M25551 Pain in right hip: Secondary | ICD-10-CM | POA: Diagnosis not present

## 2014-12-13 DIAGNOSIS — M545 Low back pain: Secondary | ICD-10-CM | POA: Diagnosis not present

## 2014-12-13 DIAGNOSIS — M25551 Pain in right hip: Secondary | ICD-10-CM | POA: Diagnosis not present

## 2014-12-13 DIAGNOSIS — M25552 Pain in left hip: Secondary | ICD-10-CM | POA: Diagnosis not present

## 2014-12-20 DIAGNOSIS — M25551 Pain in right hip: Secondary | ICD-10-CM | POA: Diagnosis not present

## 2014-12-20 DIAGNOSIS — M545 Low back pain: Secondary | ICD-10-CM | POA: Diagnosis not present

## 2014-12-20 DIAGNOSIS — M25552 Pain in left hip: Secondary | ICD-10-CM | POA: Diagnosis not present

## 2014-12-23 DIAGNOSIS — M25552 Pain in left hip: Secondary | ICD-10-CM | POA: Diagnosis not present

## 2014-12-23 DIAGNOSIS — M545 Low back pain: Secondary | ICD-10-CM | POA: Diagnosis not present

## 2014-12-23 DIAGNOSIS — M25551 Pain in right hip: Secondary | ICD-10-CM | POA: Diagnosis not present

## 2014-12-27 DIAGNOSIS — M25552 Pain in left hip: Secondary | ICD-10-CM | POA: Diagnosis not present

## 2014-12-27 DIAGNOSIS — M25551 Pain in right hip: Secondary | ICD-10-CM | POA: Diagnosis not present

## 2014-12-27 DIAGNOSIS — M545 Low back pain: Secondary | ICD-10-CM | POA: Diagnosis not present

## 2014-12-29 DIAGNOSIS — M5441 Lumbago with sciatica, right side: Secondary | ICD-10-CM | POA: Diagnosis not present

## 2014-12-30 DIAGNOSIS — M25551 Pain in right hip: Secondary | ICD-10-CM | POA: Diagnosis not present

## 2014-12-30 DIAGNOSIS — M545 Low back pain: Secondary | ICD-10-CM | POA: Diagnosis not present

## 2014-12-30 DIAGNOSIS — M25552 Pain in left hip: Secondary | ICD-10-CM | POA: Diagnosis not present

## 2015-01-04 DIAGNOSIS — M545 Low back pain: Secondary | ICD-10-CM | POA: Diagnosis not present

## 2015-01-11 DIAGNOSIS — M25551 Pain in right hip: Secondary | ICD-10-CM | POA: Diagnosis not present

## 2015-01-11 DIAGNOSIS — M545 Low back pain: Secondary | ICD-10-CM | POA: Diagnosis not present

## 2015-01-11 DIAGNOSIS — M25552 Pain in left hip: Secondary | ICD-10-CM | POA: Diagnosis not present

## 2015-01-14 DIAGNOSIS — M5441 Lumbago with sciatica, right side: Secondary | ICD-10-CM | POA: Diagnosis not present

## 2015-02-07 DIAGNOSIS — M5416 Radiculopathy, lumbar region: Secondary | ICD-10-CM | POA: Diagnosis not present

## 2015-02-10 DIAGNOSIS — Z0000 Encounter for general adult medical examination without abnormal findings: Secondary | ICD-10-CM | POA: Diagnosis not present

## 2015-02-10 DIAGNOSIS — E039 Hypothyroidism, unspecified: Secondary | ICD-10-CM | POA: Diagnosis not present

## 2015-02-10 DIAGNOSIS — I10 Essential (primary) hypertension: Secondary | ICD-10-CM | POA: Diagnosis not present

## 2015-02-10 DIAGNOSIS — E782 Mixed hyperlipidemia: Secondary | ICD-10-CM | POA: Diagnosis not present

## 2015-02-10 DIAGNOSIS — Z79899 Other long term (current) drug therapy: Secondary | ICD-10-CM | POA: Diagnosis not present

## 2015-02-10 DIAGNOSIS — Z1389 Encounter for screening for other disorder: Secondary | ICD-10-CM | POA: Diagnosis not present

## 2015-02-12 DIAGNOSIS — R079 Chest pain, unspecified: Secondary | ICD-10-CM | POA: Diagnosis not present

## 2015-02-12 DIAGNOSIS — M549 Dorsalgia, unspecified: Secondary | ICD-10-CM | POA: Diagnosis not present

## 2015-02-12 DIAGNOSIS — M79602 Pain in left arm: Secondary | ICD-10-CM | POA: Diagnosis not present

## 2015-02-12 DIAGNOSIS — M79601 Pain in right arm: Secondary | ICD-10-CM | POA: Diagnosis not present

## 2015-02-12 DIAGNOSIS — M542 Cervicalgia: Secondary | ICD-10-CM | POA: Diagnosis not present

## 2015-02-14 DIAGNOSIS — Z1389 Encounter for screening for other disorder: Secondary | ICD-10-CM | POA: Diagnosis not present

## 2015-02-14 DIAGNOSIS — E039 Hypothyroidism, unspecified: Secondary | ICD-10-CM | POA: Diagnosis not present

## 2015-02-14 DIAGNOSIS — I10 Essential (primary) hypertension: Secondary | ICD-10-CM | POA: Diagnosis not present

## 2015-02-14 DIAGNOSIS — Z0000 Encounter for general adult medical examination without abnormal findings: Secondary | ICD-10-CM | POA: Diagnosis not present

## 2015-02-14 DIAGNOSIS — E782 Mixed hyperlipidemia: Secondary | ICD-10-CM | POA: Diagnosis not present

## 2015-02-14 DIAGNOSIS — Z79899 Other long term (current) drug therapy: Secondary | ICD-10-CM | POA: Diagnosis not present

## 2015-03-17 DIAGNOSIS — E782 Mixed hyperlipidemia: Secondary | ICD-10-CM | POA: Diagnosis not present

## 2015-03-17 DIAGNOSIS — M4692 Unspecified inflammatory spondylopathy, cervical region: Secondary | ICD-10-CM | POA: Diagnosis not present

## 2015-03-17 DIAGNOSIS — I10 Essential (primary) hypertension: Secondary | ICD-10-CM | POA: Diagnosis not present

## 2015-03-17 DIAGNOSIS — E039 Hypothyroidism, unspecified: Secondary | ICD-10-CM | POA: Diagnosis not present

## 2015-03-17 DIAGNOSIS — M25551 Pain in right hip: Secondary | ICD-10-CM | POA: Diagnosis not present

## 2015-04-11 DIAGNOSIS — M7062 Trochanteric bursitis, left hip: Secondary | ICD-10-CM | POA: Diagnosis not present

## 2015-04-11 DIAGNOSIS — M7061 Trochanteric bursitis, right hip: Secondary | ICD-10-CM | POA: Diagnosis not present

## 2015-05-09 DIAGNOSIS — M7061 Trochanteric bursitis, right hip: Secondary | ICD-10-CM | POA: Diagnosis not present

## 2015-05-09 DIAGNOSIS — M7062 Trochanteric bursitis, left hip: Secondary | ICD-10-CM | POA: Diagnosis not present

## 2015-05-11 DIAGNOSIS — M7061 Trochanteric bursitis, right hip: Secondary | ICD-10-CM | POA: Diagnosis not present

## 2015-05-11 DIAGNOSIS — M7062 Trochanteric bursitis, left hip: Secondary | ICD-10-CM | POA: Diagnosis not present

## 2015-05-16 DIAGNOSIS — M7062 Trochanteric bursitis, left hip: Secondary | ICD-10-CM | POA: Diagnosis not present

## 2015-05-16 DIAGNOSIS — M7061 Trochanteric bursitis, right hip: Secondary | ICD-10-CM | POA: Diagnosis not present

## 2015-05-24 DIAGNOSIS — M7062 Trochanteric bursitis, left hip: Secondary | ICD-10-CM | POA: Diagnosis not present

## 2015-05-24 DIAGNOSIS — M7061 Trochanteric bursitis, right hip: Secondary | ICD-10-CM | POA: Diagnosis not present

## 2015-05-25 ENCOUNTER — Encounter: Payer: Self-pay | Admitting: Internal Medicine

## 2015-05-26 DIAGNOSIS — M7061 Trochanteric bursitis, right hip: Secondary | ICD-10-CM | POA: Diagnosis not present

## 2015-05-26 DIAGNOSIS — M7062 Trochanteric bursitis, left hip: Secondary | ICD-10-CM | POA: Diagnosis not present

## 2015-05-30 DIAGNOSIS — M7062 Trochanteric bursitis, left hip: Secondary | ICD-10-CM | POA: Diagnosis not present

## 2015-05-30 DIAGNOSIS — M7061 Trochanteric bursitis, right hip: Secondary | ICD-10-CM | POA: Diagnosis not present

## 2015-06-02 DIAGNOSIS — M7061 Trochanteric bursitis, right hip: Secondary | ICD-10-CM | POA: Diagnosis not present

## 2015-06-02 DIAGNOSIS — M7062 Trochanteric bursitis, left hip: Secondary | ICD-10-CM | POA: Diagnosis not present

## 2015-06-02 DIAGNOSIS — M2550 Pain in unspecified joint: Secondary | ICD-10-CM | POA: Diagnosis not present

## 2015-06-02 DIAGNOSIS — M150 Primary generalized (osteo)arthritis: Secondary | ICD-10-CM | POA: Diagnosis not present

## 2015-06-02 DIAGNOSIS — R5383 Other fatigue: Secondary | ICD-10-CM | POA: Diagnosis not present

## 2015-06-03 DIAGNOSIS — L719 Rosacea, unspecified: Secondary | ICD-10-CM | POA: Diagnosis not present

## 2015-06-08 DIAGNOSIS — M7062 Trochanteric bursitis, left hip: Secondary | ICD-10-CM | POA: Diagnosis not present

## 2015-06-08 DIAGNOSIS — M7061 Trochanteric bursitis, right hip: Secondary | ICD-10-CM | POA: Diagnosis not present

## 2015-06-10 DIAGNOSIS — M7061 Trochanteric bursitis, right hip: Secondary | ICD-10-CM | POA: Diagnosis not present

## 2015-06-10 DIAGNOSIS — M7062 Trochanteric bursitis, left hip: Secondary | ICD-10-CM | POA: Diagnosis not present

## 2015-06-13 DIAGNOSIS — M70031 Crepitant synovitis (acute) (chronic), right wrist: Secondary | ICD-10-CM | POA: Diagnosis not present

## 2015-06-13 DIAGNOSIS — M7062 Trochanteric bursitis, left hip: Secondary | ICD-10-CM | POA: Diagnosis not present

## 2015-06-13 DIAGNOSIS — M7061 Trochanteric bursitis, right hip: Secondary | ICD-10-CM | POA: Diagnosis not present

## 2015-06-15 DIAGNOSIS — M7061 Trochanteric bursitis, right hip: Secondary | ICD-10-CM | POA: Diagnosis not present

## 2015-06-15 DIAGNOSIS — M7062 Trochanteric bursitis, left hip: Secondary | ICD-10-CM | POA: Diagnosis not present

## 2015-06-21 DIAGNOSIS — M7061 Trochanteric bursitis, right hip: Secondary | ICD-10-CM | POA: Diagnosis not present

## 2015-06-21 DIAGNOSIS — M7062 Trochanteric bursitis, left hip: Secondary | ICD-10-CM | POA: Diagnosis not present

## 2015-06-23 DIAGNOSIS — M7062 Trochanteric bursitis, left hip: Secondary | ICD-10-CM | POA: Diagnosis not present

## 2015-06-23 DIAGNOSIS — M7061 Trochanteric bursitis, right hip: Secondary | ICD-10-CM | POA: Diagnosis not present

## 2015-06-27 DIAGNOSIS — M7061 Trochanteric bursitis, right hip: Secondary | ICD-10-CM | POA: Diagnosis not present

## 2015-06-27 DIAGNOSIS — M7062 Trochanteric bursitis, left hip: Secondary | ICD-10-CM | POA: Diagnosis not present

## 2015-07-01 DIAGNOSIS — M7062 Trochanteric bursitis, left hip: Secondary | ICD-10-CM | POA: Diagnosis not present

## 2015-07-01 DIAGNOSIS — M7061 Trochanteric bursitis, right hip: Secondary | ICD-10-CM | POA: Diagnosis not present

## 2015-07-11 DIAGNOSIS — H5213 Myopia, bilateral: Secondary | ICD-10-CM | POA: Diagnosis not present

## 2015-07-11 DIAGNOSIS — H5203 Hypermetropia, bilateral: Secondary | ICD-10-CM | POA: Diagnosis not present

## 2015-07-11 DIAGNOSIS — H524 Presbyopia: Secondary | ICD-10-CM | POA: Diagnosis not present

## 2015-07-11 DIAGNOSIS — M7062 Trochanteric bursitis, left hip: Secondary | ICD-10-CM | POA: Diagnosis not present

## 2015-07-11 DIAGNOSIS — Z0100 Encounter for examination of eyes and vision without abnormal findings: Secondary | ICD-10-CM | POA: Diagnosis not present

## 2015-07-11 DIAGNOSIS — H5210 Myopia, unspecified eye: Secondary | ICD-10-CM | POA: Diagnosis not present

## 2015-07-11 DIAGNOSIS — M7061 Trochanteric bursitis, right hip: Secondary | ICD-10-CM | POA: Diagnosis not present

## 2015-07-11 DIAGNOSIS — H52209 Unspecified astigmatism, unspecified eye: Secondary | ICD-10-CM | POA: Diagnosis not present

## 2015-07-15 DIAGNOSIS — M7061 Trochanteric bursitis, right hip: Secondary | ICD-10-CM | POA: Diagnosis not present

## 2015-07-15 DIAGNOSIS — M7062 Trochanteric bursitis, left hip: Secondary | ICD-10-CM | POA: Diagnosis not present

## 2015-07-20 DIAGNOSIS — M7062 Trochanteric bursitis, left hip: Secondary | ICD-10-CM | POA: Diagnosis not present

## 2015-07-20 DIAGNOSIS — M7061 Trochanteric bursitis, right hip: Secondary | ICD-10-CM | POA: Diagnosis not present

## 2015-07-22 DIAGNOSIS — M7062 Trochanteric bursitis, left hip: Secondary | ICD-10-CM | POA: Diagnosis not present

## 2015-07-22 DIAGNOSIS — M7061 Trochanteric bursitis, right hip: Secondary | ICD-10-CM | POA: Diagnosis not present

## 2015-07-25 DIAGNOSIS — M7062 Trochanteric bursitis, left hip: Secondary | ICD-10-CM | POA: Diagnosis not present

## 2015-07-25 DIAGNOSIS — M7061 Trochanteric bursitis, right hip: Secondary | ICD-10-CM | POA: Diagnosis not present

## 2015-07-28 DIAGNOSIS — M7062 Trochanteric bursitis, left hip: Secondary | ICD-10-CM | POA: Diagnosis not present

## 2015-07-28 DIAGNOSIS — M7061 Trochanteric bursitis, right hip: Secondary | ICD-10-CM | POA: Diagnosis not present

## 2015-08-01 DIAGNOSIS — M7062 Trochanteric bursitis, left hip: Secondary | ICD-10-CM | POA: Diagnosis not present

## 2015-08-01 DIAGNOSIS — M7061 Trochanteric bursitis, right hip: Secondary | ICD-10-CM | POA: Diagnosis not present

## 2015-08-05 DIAGNOSIS — M7062 Trochanteric bursitis, left hip: Secondary | ICD-10-CM | POA: Diagnosis not present

## 2015-08-05 DIAGNOSIS — M7061 Trochanteric bursitis, right hip: Secondary | ICD-10-CM | POA: Diagnosis not present

## 2015-08-08 DIAGNOSIS — M7062 Trochanteric bursitis, left hip: Secondary | ICD-10-CM | POA: Diagnosis not present

## 2015-08-08 DIAGNOSIS — M7061 Trochanteric bursitis, right hip: Secondary | ICD-10-CM | POA: Diagnosis not present

## 2015-08-15 ENCOUNTER — Other Ambulatory Visit: Payer: Self-pay | Admitting: Family Medicine

## 2015-08-15 DIAGNOSIS — Z1231 Encounter for screening mammogram for malignant neoplasm of breast: Secondary | ICD-10-CM

## 2015-08-16 DIAGNOSIS — M7061 Trochanteric bursitis, right hip: Secondary | ICD-10-CM | POA: Diagnosis not present

## 2015-08-16 DIAGNOSIS — M7062 Trochanteric bursitis, left hip: Secondary | ICD-10-CM | POA: Diagnosis not present

## 2015-08-24 DIAGNOSIS — M7062 Trochanteric bursitis, left hip: Secondary | ICD-10-CM | POA: Diagnosis not present

## 2015-08-24 DIAGNOSIS — M7061 Trochanteric bursitis, right hip: Secondary | ICD-10-CM | POA: Diagnosis not present

## 2015-08-29 DIAGNOSIS — M7062 Trochanteric bursitis, left hip: Secondary | ICD-10-CM | POA: Diagnosis not present

## 2015-08-29 DIAGNOSIS — M7061 Trochanteric bursitis, right hip: Secondary | ICD-10-CM | POA: Diagnosis not present

## 2015-09-01 DIAGNOSIS — M7061 Trochanteric bursitis, right hip: Secondary | ICD-10-CM | POA: Diagnosis not present

## 2015-09-01 DIAGNOSIS — M7062 Trochanteric bursitis, left hip: Secondary | ICD-10-CM | POA: Diagnosis not present

## 2015-09-05 DIAGNOSIS — M7061 Trochanteric bursitis, right hip: Secondary | ICD-10-CM | POA: Diagnosis not present

## 2015-09-05 DIAGNOSIS — M7062 Trochanteric bursitis, left hip: Secondary | ICD-10-CM | POA: Diagnosis not present

## 2015-09-08 DIAGNOSIS — M7062 Trochanteric bursitis, left hip: Secondary | ICD-10-CM | POA: Diagnosis not present

## 2015-09-08 DIAGNOSIS — M7061 Trochanteric bursitis, right hip: Secondary | ICD-10-CM | POA: Diagnosis not present

## 2015-09-13 DIAGNOSIS — M7062 Trochanteric bursitis, left hip: Secondary | ICD-10-CM | POA: Diagnosis not present

## 2015-09-13 DIAGNOSIS — M7061 Trochanteric bursitis, right hip: Secondary | ICD-10-CM | POA: Diagnosis not present

## 2015-09-14 ENCOUNTER — Ambulatory Visit
Admission: RE | Admit: 2015-09-14 | Discharge: 2015-09-14 | Disposition: A | Discharge status: Home / Self Care | Payer: Commercial Managed Care - HMO | Source: Ambulatory Visit | Attending: Family Medicine | Admitting: Family Medicine

## 2015-09-14 DIAGNOSIS — Z1231 Encounter for screening mammogram for malignant neoplasm of breast: Secondary | ICD-10-CM

## 2015-09-22 DIAGNOSIS — M7061 Trochanteric bursitis, right hip: Secondary | ICD-10-CM | POA: Diagnosis not present

## 2015-09-22 DIAGNOSIS — M7062 Trochanteric bursitis, left hip: Secondary | ICD-10-CM | POA: Diagnosis not present

## 2015-09-26 DIAGNOSIS — M7061 Trochanteric bursitis, right hip: Secondary | ICD-10-CM | POA: Diagnosis not present

## 2015-09-26 DIAGNOSIS — M7062 Trochanteric bursitis, left hip: Secondary | ICD-10-CM | POA: Diagnosis not present

## 2015-09-30 DIAGNOSIS — M7061 Trochanteric bursitis, right hip: Secondary | ICD-10-CM | POA: Diagnosis not present

## 2015-09-30 DIAGNOSIS — M7062 Trochanteric bursitis, left hip: Secondary | ICD-10-CM | POA: Diagnosis not present

## 2015-10-03 DIAGNOSIS — M7062 Trochanteric bursitis, left hip: Secondary | ICD-10-CM | POA: Diagnosis not present

## 2015-10-03 DIAGNOSIS — M25551 Pain in right hip: Secondary | ICD-10-CM | POA: Diagnosis not present

## 2015-10-03 DIAGNOSIS — M25552 Pain in left hip: Secondary | ICD-10-CM | POA: Diagnosis not present

## 2015-10-03 DIAGNOSIS — M7061 Trochanteric bursitis, right hip: Secondary | ICD-10-CM | POA: Diagnosis not present

## 2015-10-05 ENCOUNTER — Other Ambulatory Visit: Payer: Self-pay

## 2015-10-05 NOTE — Patient Outreach (Signed)
[facility] Care Management  10/05/2015  Gabrielle Whitney 12/17/1933 [id number]   Telephonic Screening   Referral Date:  09/21/15 Source:  Emm[facility] Prevent Call  Issue:    Outreach Call #1 to patient [phone] Patient not reached.   RN CM left HIPAA compliant voice message with name and number for call back.  RN CM scheduled for next outreach call within one week.   Gabrielle Whitney, MSHL, BSN, RN, CCM  [facility] Care Management Care Management Coordinator [phone] Direct [phone] Cell [phone] Office [phone] Fax [email or web address]

## 2015-10-05 NOTE — Patient Outreach (Signed)
[facility]) Care Management  10/05/2015  Gabrielle Whitney [birth year] [id number]    Telephonic Screening and Initial Assessment   Referral Date:  09/21/15 Source:  Emmi Prevent Call  Issue:  Patient feels her health is worsening and going to physical therapy. H/o HTN.  Patient screened positive for need for Telephonic CM services.   Inbound call from patient returning RN CM's attempted call.  Patient completed Screening and Initial Assessment.   [address]  [place] [address] [phone] (H)  Providers: Primary MD:  Juluis Rainier - last appt:  02/10/2015   next appt: ____unknown Orthodontist:    Optometrist: [facility] MD, [address] - last appt 07/2015 (Patient unable to provide name) Opthalmologist:  Dr. Hazle Quant - next appt 11/05/15  OP PT services 2 times a week and decreasing to 1 time a week. (Patient unable to provide name of provider)  Social: Patient lives in her home alone.  Patient has son/[place] and daughter/.  Patient states she has noted a difference in her ability to understand things.   Mobility: Ambulating with a cane as needed.  States ambulation and activity is difficult due to pain in hips and back associated to bursitis.  Patient has noted some improvement with stairs since starting OP PT services.  Falls: none Pain: Hips and back associated to bursitis.  Patient just had 2 injections into hip for bursitis management.   Pain medication:  Duloxetine 30 mg every day and Tumeric.  Patient has joined a gym, ACT, attends the [employer] and has a pool in her community.    Patient is interested in possible massage therapy and plans to discuss with her MD on next appt..  Depression:  No but feels a need to be with people and family more.  Patient feels bored and feels she may have some underlying depression associated to pain and loss of activity. Transportation:  Car being serviced.  Using a friend.  Still driving.  Caregiver:  Daughter Emergency Contact: Daughter  Advance Directive: No but interested.  Consent:  yes Resources: none DME: cane, BP cuff, scales, eyeglasses, Lower Left dental bridge, orthotic shoe inserts, Night time mouth piece.  Co-morbidities:   Essential HTN, Hyperlipidemia, Sleep Apnea, Insomnia, Hypothyroidism, Depression, fibromyalgia, Osteoarthritis, Spondylosis, GERD.  H/o tracheostomy 03/2014.  Admissions: 0 ER visits: 0  Event of pain across shoulders and upper arms;  Chest x-ray completed 6 months ago and followed-up via urgent care.    HTN BP 132/68 06/03/2015 Weight 175 lb (80 kg) 06/03/2015 Height 63 in (159 cm) 06/03/2015 BMI 31.57 (Obese Class I) 06/03/2015 Patient self checks BP at home sometimes but not regularly.  Patient states she needs nutritional education.   Sleep Apnea Night time mouth piece:  obtained through Orthodontics services to assist with sleep apnea management due to patient unable to tolerate the CPAP (2016).   Patient feels that she was unable to tolerate the CPAP due to past experience 03/2014 when she had to have a tracheotomy for urgent needs.  H/o followed up with MD and treated for allergies; sent home with Rx for nasal spray.  Was not getting any better and was using a vaporizer with no improvement.  Called 911 and had swollen glands causing difficulty breathing; gave breathing treatment but was not able to breath requiring tracheotomy and hospital admission for about 6 days.  Patient reports no breathing issues and only required tracheostomy for acute / urgent needs.   Medications:  Patient taking  less than  15 medications  Co-pay cost issues: h/o some medication recommendations that patient would not buy due to cost.  Patient gave example as Voltaren and another med that cost $1,300.00 that she is not able to provide the med name.  Prevnar (PCV13) Va 10/05/2013 Pneumovax (PPSV2 12/10/2001 Flu Vaccine 08/29/2015 tDAP Vaccine 06/27/2011 Medication Reconciliation  completed with patient 10/05/2015  Encounter Medications:  Outpatient Encounter Prescriptions as of 10/05/2015  Medication Sig Note  . ALPRAZolam (XANAX) 0.5 MG tablet Take 0.25-0.5 mg by mouth at bedtime as needed for anxiety. 10/06/2015: Taking for sleep on occasion only.     Marland Kitchen amLODipine (NORVASC) 5 MG tablet Take 5 mg by mouth every morning. 03/20/2014: Patient is prescribed to take this medication: 10 mg daily, but she made the change herself to 5 mg daily  because she was tired of taking so many medications.    Marland Kitchen levothyroxine (SYNTHROID, LEVOTHROID) 100 MCG tablet Take 100 mcg by mouth daily before breakfast. 03/20/2014: [facility]   . losartan (COZAAR) 50 MG tablet Take 50 mg by mouth daily. 03/20/2014: Patient is prescribed to take this medication: 100 mg daily, but she made the change herself to 50 mg daily  because she was tired of taking so many medications.  . metoprolol (LOPRESSOR) 50 MG tablet Take 50 mg by mouth every morning.   . potassium chloride 20 MEQ TBCR Take 20 mEq by mouth daily.   . clindamycin (CLEOCIN) 150 MG capsule Take 1 capsule (150 mg total) by mouth 3 (three) times daily. (Patient not taking: Reported on 10/05/2015)   . dexamethasone (DECADRON) 2 MG tablet Take 1 tablet (2 mg total) by mouth daily. (Patient not taking: Reported on 10/05/2015)   . LORazepam (ATIVAN) 1 MG tablet Take 1 tablet (1 mg total) by mouth every 12 (twelve) hours as needed for anxiety. (Patient not taking: Reported on 10/05/2015)   . sertraline (ZOLOFT) 100 MG tablet Take 100 mg by mouth every morning.    No facility-administered encounter medications on file as of 10/05/2015.     Functional Status:  In your present state of health, do you have any difficulty performing the following activities: 10/06/2015  Hearing? N  Vision? Y  Difficulty concentrating or making decisions? N  Walking or climbing stairs? Y  Dressing or bathing? N  Doing errands, shopping? N  Preparing Food and eating ? N  Using  the Toilet? N  In the past six months, have you accidently leaked urine? N  Do you have problems with loss of bowel control? N  Managing your Medications? N  Managing your Finances? N  Housekeeping or managing your Housekeeping? N  Some recent data might be hidden    Fall/Depression Screening: PHQ 2/9 Scores 10/05/2015  PHQ - 2 Score 1    Fall Risk  10/06/2015  Falls in the past year? No    Preventives: Hearing:  had screening in [employer] and was advised in some changes noted in the left ear.  Patient states she has noticed a change in her ability to understand which may be associated to her hearing.   Eyes:  Dr. (patient unable to provide name)   - [facility], [address]   -Routine eye appt 07/2015 -Bilateral Cataract surgery pending - Cataract consultation appt with  Dr. Hazle Quant 11/05/15  Dentist:  1 1/2 years ago.  Patient is searching for a new dentist.  Patient states she wants to locate a in-network provider but also states she does not have dental insurance.  Patient has a Left lower bridge which needs replacing.    Podiatrist:  Using ortho shoe inserts in  tennis shoes for about approximately 4-5 years but not wearing all the time.  Mammogram:  09/14/2015  Bone Density: none in a long time. Patient does take Calcium Colonoscopy:  10/31/2009    Assessment: Patient states she needs someone to take time with her.  States she appreciates the time that RN CM has shared with her today. States she needs someone to help her get an appt with a dentist, and needs nutritional education.  States pain gets in the way of her daily activities and social life and she does not see her children very much.  Patient noted to have some difficulty with recall of information this assessment.  Patient described her hearing changes as "more difficulty understanding  than not hearing."  Plan:  Referral:  09/21/2015 Telephonic Screening and Initial Assessment:  10/05/15 Telephonic RN CM Services:  10/05/15 Program:  HTN 10/05/15   HTN -RN CM advised to start self checking home BP more regularly. Emmi Educational Materials mailed 10/05/15 -High Blood Pressure (Hypertension):  Health Problems -High Blood Pressure (Hypertension): What You Can Do -Keeping An Eye On Your Blood Pressure -Medications For High Blood Pressure (Hypertension) -Low-Salt Diet -Advance Directive Document -OSA Emmi Education materials.   Medication management education:   -Advised to take Cymbalta as prescribed and report back to MD if symptoms of nausea do not improve over the next week.  -Advised that Cymbalta helps to manage pain and depression which would be beneficial.  Advised best to hold Tumeric until patient is able to identify if nausea is associated to Cymbalta verses Tumeric.  RN CM will follow-up on Benefits  -research for massage therapy -Dentist needed -Hearing Screening needed.   -Possible need for orthotic shoe inserts updated.  Advised proper body alignment will help improve pain and should wear as ordered.  Advised pain will increase initially until patient gets used to new alignment from orthotics.    [facility] sent 10/05/15 -[facility] and Activities.   RN CM advised in next [facility] scheduled contact call within next 30 days for monthly assessment and care coordination services as needed. RN CM advised to please notify MD of any changes in condition prior to scheduled appt's.   RN CM provided contact name and # [phone] or main office # [phone] and 24-hour nurse line # [phone].  RN CM confirmed patient is aware of 911 services for urgent emergency needs.  Patient successful outreach letter and [facility] Introductory package:  10/05/15 Physician Enrollment/Barriers Letter and Initial Assessment to Primary MD:  10/05/15 [facility] Care Management Assistant notified: 10/05/15 -agreed to services/case opened. -Primary MD update:  Dr. Juluis RainierElizabeth Barnes with [facility].    [facility] Care Plan Problem One   Flowsheet Row Most Recent Value  Care Plan Problem One  Knowledge deficit relating to HTN educationa and self management   Role Documenting the Problem One  Care Management Telephonic Coordinator  Care Plan for Problem One  Active  THN Long Term Goal (31-90 days)  Patient will improve self management of HTN with improved education as evidenced by logging BP and taking to MD appts over the next 31-90 days.   THN Long Term Goal Start Date  10/06/15  Interventions for Problem One Long Term Goal  RN CM will provide verbal and Emmi Education materials to patient on HTN management over the next 30 days.   THN CM  Short Term Goal #1 (0-30 days)  Patient will start checking and logging BP at least 2 times a week over the next 30 days.   THN CM Short Term Goal #1 Start Date  10/06/15  Interventions for Short Term Goal #1  RN CM will mail BP / Weight logging sheet to patient to motivate patient in this intervention over the next 30 days.     [facility] CM Care Plan Problem Two   Flowsheet Row Most Recent Value  Care Plan Problem Two  Knowledge deficit relating to OSA management   Role Documenting the Problem Two  Care Management Telephonic Coordinator  Care Plan for Problem Two  Active  THN CM Short Term Goal #1 (0-30 days)  Patient will improve knowledge of condition and self management interventions to avoid risk associated to unmanaged OSA over the next 30 days.   THN CM Short Term Goal #1 Start Date  10/06/15  Interventions for Short Term Goal #2   RN CM will provide verbal and Emmi Educational materials over the next 30 days.     [facility] CM Care Plan Problem Three   Flowsheet Row Most Recent Value  Care Plan Problem Three  Advance Directives   Role Documenting the Problem Three  Care Management Telephonic Coordinator  Care Plan for Problem Three  Active  THN CM Short Term Goal #1 (0-30 days)  Patient will review copy of Advance Directive over the next 30 days.   THN CM  Short Term Goal #1 Start Date  10/06/15  Interventions for Short Term Goal #1  RN CM will mail copy of Advance Directive over the next 30 days.        Simmie Davies, BSN, RN, CCM  [facility] [employer] [facility] Care Management Coordinator [phone] Direct [phone] Cell [phone] Office [phone] Fax [email or web address]

## 2015-10-07 DIAGNOSIS — M7062 Trochanteric bursitis, left hip: Secondary | ICD-10-CM | POA: Diagnosis not present

## 2015-10-07 DIAGNOSIS — M7061 Trochanteric bursitis, right hip: Secondary | ICD-10-CM | POA: Diagnosis not present

## 2015-10-13 DIAGNOSIS — M7061 Trochanteric bursitis, right hip: Secondary | ICD-10-CM | POA: Diagnosis not present

## 2015-10-13 DIAGNOSIS — M7062 Trochanteric bursitis, left hip: Secondary | ICD-10-CM | POA: Diagnosis not present

## 2015-10-19 ENCOUNTER — Ambulatory Visit (INDEPENDENT_AMBULATORY_CARE_PROVIDER_SITE_OTHER): Payer: Commercial Managed Care - HMO | Admitting: Orthopaedic Surgery

## 2015-10-19 DIAGNOSIS — M7061 Trochanteric bursitis, right hip: Secondary | ICD-10-CM | POA: Diagnosis not present

## 2015-10-19 DIAGNOSIS — M539 Dorsopathy, unspecified: Secondary | ICD-10-CM | POA: Diagnosis not present

## 2015-10-19 DIAGNOSIS — M7062 Trochanteric bursitis, left hip: Secondary | ICD-10-CM

## 2015-10-19 DIAGNOSIS — M25551 Pain in right hip: Secondary | ICD-10-CM | POA: Diagnosis not present

## 2015-11-02 ENCOUNTER — Other Ambulatory Visit: Payer: Self-pay

## 2015-11-02 DIAGNOSIS — H524 Presbyopia: Secondary | ICD-10-CM | POA: Diagnosis not present

## 2015-11-02 DIAGNOSIS — H25043 Posterior subcapsular polar age-related cataract, bilateral: Secondary | ICD-10-CM | POA: Diagnosis not present

## 2015-11-02 NOTE — Patient Outreach (Signed)
[facility]) Care Management  11/02/2015  Elder Loverma T Semrad 10/19/1933 [id number]   Telephonic Monthly Assessment  Outreach call #1.  Patient not reached.   RN CM left HIPAA compliant voice message with name and number.  RN CM scheduled for next outreach call within one week.   Simmie Daviesrystal Eyad Rochford, MSHL, BSN, RN, CCM  [facility] Care Management Care Management Coordinator [phone] Direct [phone] Cell [phone] Office [phone] Fax [email or web address]

## 2015-11-03 ENCOUNTER — Ambulatory Visit (INDEPENDENT_AMBULATORY_CARE_PROVIDER_SITE_OTHER): Payer: Commercial Managed Care - HMO | Admitting: Physical Medicine and Rehabilitation

## 2015-11-03 ENCOUNTER — Encounter (INDEPENDENT_AMBULATORY_CARE_PROVIDER_SITE_OTHER): Payer: Self-pay | Admitting: Physical Medicine and Rehabilitation

## 2015-11-03 ENCOUNTER — Ambulatory Visit: Payer: Self-pay

## 2015-11-03 ENCOUNTER — Other Ambulatory Visit (INDEPENDENT_AMBULATORY_CARE_PROVIDER_SITE_OTHER): Payer: Self-pay | Admitting: Orthopaedic Surgery

## 2015-11-03 VITALS — BP 139/76 | HR 66 | Temp 98.2°F

## 2015-11-03 DIAGNOSIS — M47816 Spondylosis without myelopathy or radiculopathy, lumbar region: Secondary | ICD-10-CM

## 2015-11-03 DIAGNOSIS — M5416 Radiculopathy, lumbar region: Secondary | ICD-10-CM

## 2015-11-03 DIAGNOSIS — M4317 Spondylolisthesis, lumbosacral region: Secondary | ICD-10-CM

## 2015-11-03 MED ORDER — LIDOCAINE HCL (PF) 1 % IJ SOLN: 0.3300 mL | Freq: Once | INTRAMUSCULAR | Status: AC

## 2015-11-03 MED ORDER — METHYLPREDNISOLONE ACETATE 80 MG/ML IJ SUSP: 80.0000 mg | Freq: Once | INTRAMUSCULAR | Status: AC

## 2015-11-03 NOTE — Procedures (Signed)
S1 Lumbosacral Transforaminal Epidural Steroid Injection - Sub-Pedicular Approach with Fluoroscopic Guidance   Patient: Gabrielle Whitney      Date of Birth: 11/09/1933 MRN: [id number] PCP: Gaye AlkenBARNES,ELIZABETH STEWART, MD      Visit Date: 11/03/2015   Universal Protocol:    Date/Time: 10/26/173:57 PM  Consent Given By: the patient  Position:  PRONE  Additional Comments: Vital signs were monitored before and after the procedure. Patient was prepped and draped in the usual sterile fashion. The correct patient, procedure, and site was verified.   Injection Procedure Details:  Procedure Site One Meds Administered:  Meds ordered this encounter  Medications  . lidocaine (PF) (XYLOCAINE) 1 % injection 0.3 mL  . methylPREDNISolone acetate (DEPO-MEDROL) injection 80 mg     Laterality: Bilateral  Location/Site:  S1 Foramen   Needle size: 22 G  Needle type: Spinal  Needle Placement: Transforaminal  Findings:  -Contrast Used: 2 mL iohexol 180 mg iodine/mL   -Comments: Excellent flow of contrast along the nerve and into the epidural space.  Procedure Details: After squaring off the sacral end-plate to get a true AP view, the C-arm was positioned so that the best possible view of the S1 foramen was visualized. The soft tissues overlying this structure were infiltrated with 2-3 ml. of 1% Lidocaine without Epinephrine.    The spinal needle was inserted toward the target using a "trajectory" view along the fluoroscope beam.  Under AP and lateral visualization, the needle was advanced so it did not puncture dura. Biplanar projections were used to confirm position. Aspiration was confirmed to be negative for CSF and/or blood. A 1-2 ml. volume of Isovue-250 was injected and flow of contrast was noted at each level. Radiographs were obtained for documentation purposes.   After attaining the desired flow of contrast documented above, a 0.5 to 1.0 ml test dose of 0.25% Marcaine was injected into  each respective transforaminal space.  The patient was observed for 90 seconds post injection.  After no sensory deficits were reported, and normal lower extremity motor function was noted,   the above injectate was administered so that equal amounts of the injectate were placed at each foramen (level) into the transforaminal epidural space.   Additional Comments:  The patient tolerated the procedure well No complications occurred Dressing: Band-Aid    Post-procedure details: Patient was observed during the procedure. Post-procedure instructions were reviewed.  Patient left the clinic in stable condition.

## 2015-11-03 NOTE — Progress Notes (Signed)
Office Visit Note  Patient: Gabrielle Whitney           Date of Birth: [birth year]           MRN: [id number] Visit Date: 11/03/2015              Requested by: Juluis Rainier, MD [address] [place], [place] [address] PCP: Gaye Alken, MD   Assessment & Plan: Visit Diagnoses:  1. Lumbar radiculopathy   2. Spondylolisthesis of lumbosacral region   3. Spondylosis without myelopathy or radiculopathy, lumbar region     Follow-Up Instructions: Follow-up with Doneen Poisson in a few weeks if necessary if the pain is not better.  Orders:  Orders Placed This Encounter  Procedures  . Epidural Steroid injection    Meds ordered this encounter  Medications  . lidocaine (PF) (XYLOCAINE) 1 % injection 0.3 mL  . methylPREDNISolone acetate (DEPO-MEDROL) injection 80 mg      Procedures: S1 Lumbosacral Transforaminal Epidural Steroid Injection - Sub-Pedicular Approach with Fluoroscopic Guidance   Patient: Gabrielle Whitney      Date of Birth: [birth year] MRN: [id number] PCP: Gaye Alken, MD      Visit Date: 11/03/2015   Universal Protocol:    Date/Time: 10/26/173:57 PM  Consent Given By: the patient  Position:  PRONE  Additional Comments: Vital signs were monitored before and after the procedure. Patient was prepped and draped in the usual sterile fashion. The correct patient, procedure, and site was verified.   Injection Procedure Details:  Procedure Site One Meds Administered:  Meds ordered this encounter  Medications  . lidocaine (PF) (XYLOCAINE) 1 % injection 0.3 mL  . methylPREDNISolone acetate (DEPO-MEDROL) injection 80 mg     Laterality: Bilateral  Location/Site:  S1 Foramen   Needle size: 22 G  Needle type: Spinal  Needle Placement: Transforaminal  Findings:  -Contrast Used: 2 mL iohexol 180 mg iodine/mL   -Comments: Excellent flow of contrast along the nerve and into the epidural space.  Procedure Details: After  squaring off the sacral end-plate to get a true AP view, the C-arm was positioned so that the best possible view of the S1 foramen was visualized. The soft tissues overlying this structure were infiltrated with 2-3 ml. of 1% Lidocaine without Epinephrine.    The spinal needle was inserted toward the target using a "trajectory" view along the fluoroscope beam.  Under AP and lateral visualization, the needle was advanced so it did not puncture dura. Biplanar projections were used to confirm position. Aspiration was confirmed to be negative for CSF and/or blood. A 1-2 ml. volume of Isovue-250 was injected and flow of contrast was noted at each level. Radiographs were obtained for documentation purposes.   After attaining the desired flow of contrast documented above, a 0.5 to 1.0 ml test dose of 0.25% Marcaine was injected into each respective transforaminal space.  The patient was observed for 90 seconds post injection.  After no sensory deficits were reported, and normal lower extremity motor function was noted,   the above injectate was administered so that equal amounts of the injectate were placed at each foramen (level) into the transforaminal epidural space.   Additional Comments:  The patient tolerated the procedure well No complications occurred Dressing: Band-Aid    Post-procedure details: Patient was observed during the procedure. Post-procedure instructions were reviewed.  Patient left the clinic in stable condition.     Clinical Data: Findings:  Fluoroscopic imaging on 11/03/2015 shows a S1 transitional segment  with the right side transverse process with pseudo-joint to the sacrum and left side almost fully sacralized.    Subjective: Chief Complaint  Patient presents with  . Lower Back - Pain  . Right Hip - Pain  . Left Hip - Pain    HPI Pain in low back and into both hips. Switches from left to right. Denies groin pain. Constant pain. Used to get relief with sitting but  isn't anymore. Pain left knee to foot. Occ. Tingling in toes-both feet.She predominantly has pain down the posterior buttock and hamstring region or an S1 distribution to the side of the foot particularly on the left side. She does get some anterior pain at times. She does have multiple level findings on the MRI without high-grade stenosis. She does have a listhesis grade 1 of L5 on S1 with pretty severe facet arthropathy and some lateral recess narrowing.  Taking tylenol pm at night to help her sleep and help with pain.  Taking no blood thinner and no dye allergy.  Has driver with her today-Val  Review of Systems   Objective: Vital Signs: BP 139/76 (BP Location: Left Arm, Patient Position: Sitting, Cuff Size: Normal)   Pulse 66   Temp 98.2 F (36.8 C)   SpO2 93%    Physical Exam General appearance: NAD, conversant  Psych: Appropriate affect, alert and oriented to person, place and time  Eyes: anicteric sclerae, moist conjunctivae; no lid-lag; PERRLA Lungs: normal respiratory effort and no intercostal retractions, no wheezing CVA: normal pulses Extremities: No peripheral edema  Skin: Normal temperature, turgor and texture; no rash, ulcers or subcutaneous nodules MSK:/Neuro:   On manual muscle testing there is 5/5 strength in the distal muscle groups of the lower extremities bilaterally without deficits. There is no clonus test bilaterally.   Ortho Exam  Specialty Comments:  No specialty comments available. Imaging: No results found.   PMFS History: Patient Active Problem List   Diagnosis Date Noted  . Group A streptococcal infection 03/23/2014  . Essential hypertension 03/23/2014  . Hypothyroidism 03/23/2014  . Depression 03/23/2014  . Hypokalemia 03/23/2014  . Tracheostomy in place [facility](HCC) 03/23/2014  . Respiratory compromise 03/23/2014  . Laryngeal edema 03/23/2014  . Neck abscess 03/20/2014   Past Medical History:  Diagnosis Date  . Depression   . Difficult  intubation   . GERD (gastroesophageal reflux disease)   . Hyperlipidemia   . Hypertension   . Hypothyroidism   . Osteoporosis     History reviewed. No pertinent family history. Past Surgical History:  Procedure Laterality Date  . TRACHEOSTOMY TUBE PLACEMENT N/A 03/20/2014   Procedure: AWAKE INTUBATION, TRACHEOSTOMY WITH DRAINAGE OF ABSCESS;  Surgeon: Newman PiesSu Teoh, MD;  Location: [facility] ORS;  Service: ENT;  Laterality: N/A;   Social History   Occupational History  . Not on file.   Social History Main Topics  . Smoking status: Never Smoker  . Smokeless tobacco: Never Used  . Alcohol use No  . Drug use: No  . Sexual activity: No

## 2015-11-04 ENCOUNTER — Ambulatory Visit: Payer: Self-pay

## 2015-11-04 NOTE — Telephone Encounter (Signed)
Please advise 

## 2015-11-07 ENCOUNTER — Ambulatory Visit: Payer: Self-pay

## 2015-11-08 ENCOUNTER — Other Ambulatory Visit: Payer: Self-pay

## 2015-11-08 NOTE — Patient Outreach (Signed)
[facility]) Care Management  11/08/2015  [name] 11/29/1933 [id number]   Telephonic Monthly Assessment  Program:  HTN  10/05/15 Insurance:  [employer]   Subjective: [address]  [place] [id number] [phone] (H) Patient states she is having much difficulty finding a Education officer, communitydentist.  States she was on the phone with [employer] 1 1/2 hours yesterday and all the numbers and MD names provided were not working, moved or out of network .  States she needs to have her teeth cleaned and needs possible bridge work.    Providers: Primary MD:  Juluis RainierElizabeth Barnes - last appt:  02/10/2015   next appt: none scheduled.  Physical Medicine and Rehabilitation:  Dr. Tyrell AntonioFrederic Newton  Orthodontist:    Optometrist: [facility] MD, [place] - last appt 07/2015 (Patient unable to provide name) Opthalmologist:  Dr. Hazle Quantigby - last appt 11/05/15 - next appt 11/14/15 OP PT:  Services completed 10/2015.   (Patient unable to provide name of provider)  Social: Patient lives in her home alone.  Patient has son/[name]/.  Patient states she has noted a difference in her ability to understand things and her memory.   Mobility: Ambulating with a cane as needed.  States ambulation and activity is difficult due to pain in hips and back associated to bursitis.  Mobility improvement noted with stairs while active with OP PT services.   Falls: none Pain: Hips and back associated to bursitis.  Patient just had 2 injections into hip for bursitis management.   Pain medication:  Duloxetine 30 mg every day and Tumeric.  Patient has joined a gym, ACT, attends the [employer] and has a pool in her community.    Patient is interested in possible massage therapy and plans to discuss with her MD on next appt..  Depression:  No but feels a need to be with people and family more.  H/o Patient feels bored and feels she may have some underlying depression associated to pain and loss of  activity. Transportation:  Car being serviced. Continues to use a friend.  Still driving.  Caregiver: Daughter/Gabrielle Whitney Emergency Contact: Daughter  Advance Directive: No but interested.  States she would like her daughter/Gabrielle Whitney to be her HCPOA and her son second.   Consent:  yes Resources: none DME: cane, BP cuff, scales, eyeglasses, Lower Left dental bridge, orthotic shoe inserts, Night time mouth piece.  Co-morbidities:   Essential HTN, Hyperlipidemia, Sleep Apnea, Insomnia, Hypothyroidism, Depression, fibromyalgia, Osteoarthritis, Spondylosis, GERD.  H/o tracheostomy 03/2014.  Admissions: 0 ER visits: 0  Event of pain across shoulders and upper arms;  Chest x-ray completed 6 months ago and followed-up via urgent care.    HTN Patient self checks BP at home sometimes but not regularly.  BP 139/76 11/03/2015 Weight 175 lb (79 kg) 10/05/2015 Height 63 in (160 cm) 10/05/2015 BMI 31.10 (Obese Class I) 10/06/2015  Lipid Panel completed 02/14/2015 HDL 56.000 02/14/2015 LDL 161.000 02/14/2015 Cholesterol, total 257.000 02/14/2015 Triglycerides 202.000 02/14/2015 A1C N/D Glucose Random 106.000 02/14/2015  Sleep Apnea Night time mouth piece:  obtained through Orthodontics services to assist with sleep apnea management due to patient unable to tolerate the CPAP (2016).   Patient feels that she was unable to tolerate the CPAP due to past experience 03/2014 when she had to have a tracheotomy for urgent needs.  H/o followed up with MD and treated for allergies; sent home with Rx for nasal spray.  Was not getting any better and was using a vaporizer  with no improvement.  Called 911 and had swollen glands causing difficulty breathing; gave breathing treatment but was not able to breath requiring tracheotomy and hospital admission for about 6 days.  Patient reports no breathing issues and only required tracheostomy for acute / urgent needs.   Medications:  Patient taking  less than 15 medications  Co-pay cost issues: h/o  some medication recommendations that patient would not buy due to cost.  Patient gave example as Voltaren and another med that cost $1,300.00 that she is not able to provide the med name.  Prevnar (PCV13) [place] 10/05/2013 Pneumovax (PPSV2 12/10/2001 Flu Vaccine 08/29/2015 tDAP Vaccine 6/19/201 Patient confirms she has held the Tumeric to better observe for any relation to nausea and rule out association to Cymbalta. Medication Reconciliation completed with patient 11/08/2015   Encounter Medications:  Outpatient Encounter Prescriptions as of 11/08/2015  Medication Sig Note  . ALPRAZolam (XANAX) 0.5 MG tablet Take 0.25-0.5 mg by mouth at bedtime as needed for anxiety. 10/06/2015: Taking for sleep on occasion only.     Marland Kitchen amLODipine (NORVASC) 5 MG tablet Take 5 mg by mouth every morning. 03/20/2014: Patient is prescribed to take this medication: 10 mg daily, but she made the change herself to 5 mg daily  because she was tired of taking so many medications.    . DULoxetine (CYMBALTA) 30 MG capsule take 1 capsule by mouth once daily   . levothyroxine (SYNTHROID, LEVOTHROID) 100 MCG tablet Take 100 mcg by mouth daily before breakfast. 03/20/2014: [facility]   . losartan (COZAAR) 50 MG tablet Take 50 mg by mouth daily. 03/20/2014: Patient is prescribed to take this medication: 100 mg daily, but she made the change herself to 50 mg daily  because she was tired of taking so many medications.  . metoprolol (LOPRESSOR) 50 MG tablet Take 50 mg by mouth every morning.   . sertraline (ZOLOFT) 100 MG tablet Take 100 mg by mouth every morning.   . clindamycin (CLEOCIN) 150 MG capsule Take 1 capsule (150 mg total) by mouth 3 (three) times daily. (Patient not taking: Reported on 11/08/2015)   . dexamethasone (DECADRON) 2 MG tablet Take 1 tablet (2 mg total) by mouth daily. (Patient not taking: Reported on 11/08/2015)   . LORazepam (ATIVAN) 1 MG tablet Take 1 tablet (1 mg total) by mouth every 12 (twelve) hours as needed for  anxiety. (Patient not taking: Reported on 11/08/2015)   . potassium chloride 20 MEQ TBCR Take 20 mEq by mouth daily. (Patient not taking: Reported on 11/08/2015)    Facility-Administered Encounter Medications as of 11/08/2015  Medication  . lidocaine (PF) (XYLOCAINE) 1 % injection 0.3 mL  . methylPREDNISolone acetate (DEPO-MEDROL) injection 80 mg    Functional Status:  In your present state of health, do you have any difficulty performing the following activities: 10/06/2015  Hearing? N  Vision? Y  Difficulty concentrating or making decisions? N  Walking or climbing stairs? Y  Dressing or bathing? N  Doing errands, shopping? N  Preparing Food and eating ? N  Using the Toilet? N  In the past six months, have you accidently leaked urine? N  Do you have problems with loss of bowel control? N  Managing your Medications? N  Managing your Finances? N  Housekeeping or managing your Housekeeping? N  Some recent data might be hidden    Fall/Depression Screening: PHQ 2/9 Scores 10/05/2015  PHQ - 2 Score 1   Fall Risk  11/08/2015 10/06/2015  Falls in the  past year? No No    Preventives: Hearing:  had screening in [employer] and was advised in some changes noted in the left ear.  Patient states she has noticed a change in her ability to understand which may be associated to her hearing.   Eyes:  Dr. (patient unable to provide name)   - [facility], [address]   -Routine eye appt 07/2015 -Cataract surgery pre-op 11/14/2015.  1st eye scheduled for 11/27 2nd eye on 12/26/15.  Friend providing transportation. [facility]  States her cost is $3,400 each eye and ask CM if this is normal.  Dentist:  1 1/2 years ago.  Patient is searching for a new dentist.  Patient states she wants to locate a in-network provider but also states she does not have dental insurance.   Patient has a Left lower bridge which needs replacing.    Podiatrist:  Using ortho shoe inserts in  tennis shoes  for about approximately 4-5 years but not wearing all the time.  Mammogram:  09/14/2015  Bone Density: none in a long time. Patient does take Calcium Colonoscopy:  10/31/2009    Assessment: Referral Date:  09/21/15 Source:  Emmi Prevent Call  Issue:  Patient feels her health is worsening and going to physical therapy. H/o HTN.  Patient screened positive for need for Telephonic CM services.  Patient states she needs someone to take time with her.  States she appreciates the time that RN CM has shared with her today. States she needs someone to help her get an appt with a dentist, and needs nutritional education.  States pain gets in the way of her daily activities and social life and she does not see her children very much.  Patient noted to have some difficulty with recall of information this assessment.  Patient described her hearing changes as "more difficulty understanding  than not hearing."  Plan:  Referral:  09/21/2015 Telephonic Screening and Initial Assessment:  10/05/15 Telephonic RN CM Services: 10/05/15 Program:  HTN 10/05/15   HTN -RN CM advised to start self checking home BP more regularly. Emmi Educational Materials mailed 10/05/15 -High Blood Pressure (Hypertension):  Health Problems -High Blood Pressure (Hypertension): What You Can Do -Keeping An Eye On Your Blood Pressure -Medications For High Blood Pressure (Hypertension) -Low-Salt Diet -Advance Directive Document -OSA Emmi Education materials.   Medication management education:   -Advised to take Cymbalta as prescribed and report back to MD if symptoms of nausea do not improve over the next week.  -Advised that Cymbalta helps to manage pain and depression which would be beneficial.    RN CM will follow-up on Benefits  -Research for massage therapy -Dentist needed -Hearing Screening needed.     [facility] Social Work Referral sent 10/05/15 -[facility] [employer] and Activities.   RN CM advised in next [facility] scheduled  contact call within next 30 days for monthly assessment and care coordination services as needed. RN CM advised to please notify MD of any changes in condition prior to scheduled appt's.   RN CM provided contact name and # [phone] or main office # [phone] and 24-hour nurse line # [phone].  RN CM confirmed patient is aware of 911 services for urgent emergency needs.   [facility] CM Care Plan Problem One   Flowsheet Row Most Recent Value  Care Plan Problem One  Knowledge deficit relating to HTN educationa and self management   Role Documenting the Problem One  Care Management Telephonic Coordinator  Care Plan for Problem One  Active  THN Long Term Goal (31-90 days)  Patient will improve self management of HTN with improved education as evidenced by logging BP and taking to MD appts over the next 31-90 days.   THN Long Term Goal Start Date  10/06/15  Interventions for Problem One Long Term Goal  RN CM will provide verbal and Emmi Education materials to patient on HTN management over the next 30 days.   THN CM Short Term Goal #1 (0-30 days)  Patient will start checking and logging BP at least 2 times a week over the next 30 days.   TH[facility] CM Short Term Goal #1 Start Date  11/08/15  Interventions for Short Term Goal #1  RN CM will mail BP / Weight logging sheet to patient to motivate patient in this intervention over the next 30 days.     [facility] Problem Two   Flowsheet Row Most Recent Value  Care Plan Problem Two  Knowledge deficit relating to OSA management   Role Documenting the Problem Two  Care Management Telephonic Coordinator  Care Plan for Problem Two  Active  THN CM Short Term Goal #1 (0-30 days)  Patient will improve knowledge of condition and self management interventions to avoid risk associated to unmanaged OSA over the next 30 days.   THN CM Short Term Goal #1 Start Date  11/08/15  Interventions for Short Term Goal #2   RN CM will provide verbal and Emmi Educational  materials over the next 30 days.     [facility] CM Care [facility] Problem Three   Flowsheet Row Most Recent Value  Care Plan Problem Three  Advance Directives   Role Documenting the Problem Three  Care Management Telephonic Coordinator  Care Plan for Problem Three  Active  THN CM Short Term Goal #1 (0-30 days)  Patient will review copy of Advance Directive over the next 30 days.   THN CM Short Term Goal #1 Start Date  11/08/15  Interventions for Short Term Goal #1  RN CM will mail copy of Advance Directive over the next 30 days.       Simmie Daviesrystal Theron Cumbie, MSHL, BSN, RN, CCM  [facility] Care Management Care Management Coordinator [phone] Direct [phone] Cell [phone] Office [phone] Fax [email or web address]

## 2015-11-14 DIAGNOSIS — H2512 Age-related nuclear cataract, left eye: Secondary | ICD-10-CM | POA: Diagnosis not present

## 2015-11-14 DIAGNOSIS — H2511 Age-related nuclear cataract, right eye: Secondary | ICD-10-CM | POA: Diagnosis not present

## 2015-11-16 ENCOUNTER — Ambulatory Visit (INDEPENDENT_AMBULATORY_CARE_PROVIDER_SITE_OTHER): Payer: Commercial Managed Care - HMO | Admitting: Orthopaedic Surgery

## 2015-11-16 DIAGNOSIS — G8929 Other chronic pain: Secondary | ICD-10-CM | POA: Diagnosis not present

## 2015-11-16 DIAGNOSIS — M7062 Trochanteric bursitis, left hip: Secondary | ICD-10-CM

## 2015-11-16 DIAGNOSIS — M5442 Lumbago with sciatica, left side: Secondary | ICD-10-CM

## 2015-11-16 DIAGNOSIS — M7061 Trochanteric bursitis, right hip: Secondary | ICD-10-CM

## 2015-11-16 NOTE — Progress Notes (Signed)
Mrs. Gabrielle Whitney is following up after Dr. Alvester MorinNewton who provided an epidural steroid injection. I reviewed Dr. [place] BlasNewton's note should've this was an S1 foraminal injection. She's had good response from this injection. She still has daily pain but it's much more manageable she reports. She is someone who does swim and at 82 years old is still quite active. She does not need to ambulate with assistive device. She still reports pain over both trochanteric bursal areas and some radicular symptoms down her left leg but she says this is much more manageable.  On examination today she has some mild pain over each trochanteric area of her hips. She has excellent range of motion of both her hips. She has a negative straight leg raise bilaterally. She has good extension extension of her lumbar spine.  At this point she is doing well. She'll resume her swimming and water aerobics. She'll follow up as needed. If this worsens in any way we can always get her back to Dr. Alvester MorinNewton for repeat injection since this did work for a while.

## 2015-11-30 ENCOUNTER — Ambulatory Visit: Payer: Self-pay

## 2015-12-05 DIAGNOSIS — H25812 Combined forms of age-related cataract, left eye: Secondary | ICD-10-CM | POA: Diagnosis not present

## 2015-12-05 DIAGNOSIS — H2512 Age-related nuclear cataract, left eye: Secondary | ICD-10-CM | POA: Diagnosis not present

## 2015-12-06 ENCOUNTER — Other Ambulatory Visit (INDEPENDENT_AMBULATORY_CARE_PROVIDER_SITE_OTHER): Payer: Self-pay | Admitting: Orthopaedic Surgery

## 2015-12-06 NOTE — Telephone Encounter (Signed)
Please advise 

## 2015-12-08 ENCOUNTER — Telehealth (INDEPENDENT_AMBULATORY_CARE_PROVIDER_SITE_OTHER): Payer: Self-pay | Admitting: *Deleted

## 2015-12-08 ENCOUNTER — Other Ambulatory Visit: Payer: Self-pay

## 2015-12-08 NOTE — Telephone Encounter (Signed)
Pt nurse case manager called for refill for Cymbalta. Contact number is  [phone]. For call back when this is sent to pharmacy

## 2015-12-08 NOTE — Patient Outreach (Signed)
[facility]) Care Management  12/08/2015  Gabrielle Whitney 08/27/1933 [id number]   Telephonic Monthly Assessment  Program:  HTN  10/05/15 Insurance:  [employer] Medicare RAF Score: 1.421   Subjective: [address]  [place] [id number] [phone] (H)   Providers: Primary MD:  Juluis RainierElizabeth Barnes - last appt:  02/10/2015   next appt: none scheduled.  Physical Medicine and Rehabilitation:  Dr. Tyrell AntonioFrederic Newton  Orthopedics:  Dr. Doneen Poissonhristopher Blackman, [facility] [phone] [facility] [place]   Optometrist: [facility] MD, [facility] - last appt 07/2015 (Patient unable to provide name) Opthalmologist:  Dr. Hazle Quantigby - last appt 11/05/15 - next appt 11/14/15 OP PT:  Services completed 10/2015.   (Patient unable to provide name of provider)  Social: Patient lives in her home alone.  Patient has son/[place] and daughter/Kim/[place].  Patient states awareness of difficulty understanding instructions, details and memory issues.  Mobility: Ambulating with a cane as needed.  States ambulation and activity is difficult due to pain in hips and back associated to bursitis.  Mobility improvement noted with stairs while active with OP PT services.   Falls: none Depression:  H/o Patient feels bored and feels she may have some underlying depression associated to pain and loss of activity. Transportation:  Friend Caregiver: Daughter/Kim Emergency Contact: Daughter  Advance Directive: No but interested.  States she would like her daughter/Kim to be her HCPOA and her son second.  Patient confirms she has received copy of mailed Advance Directive.  Patient is in process of discussing with her family who will confirm if these documents have been already completed.  Consent:  yes Resources: none DME: cane, BP cuff, scales, eyeglasses, Lower Left dental bridge, orthotic shoe inserts, Night time mouth piece.  Co-morbidities:   Essential HTN, Hyperlipidemia,  Sleep Apnea, Insomnia, Hypothyroidism, Depression, fibromyalgia, Osteoarthritis, Spondylosis, GERD.  H/o tracheostomy 03/2014.  Admissions: 0 ER visits: 0  Event of pain across shoulders and upper arms;  Chest x-ray completed 6 months ago and followed-up via urgent care.    Pain:  Hips and back associated to bursitis.  Patient  had 2 injections into hip for bursitis management prior to October 2017 and states noting improvement in pain management since starting the Duloxetine 30 mg every day. Patient has joined a gym, ACT, attends the [employer] and has a pool in her community.    Patient is interested in possible massage therapy and plans to discuss with her MD on next appt. Patient states she is out of her Cymbalta for 3-4 days and [facility] get refill request from Dr. Rayburn MaBlackmon.  States she called MD office also but has not heard back yet.   HTN Patient self checks BP at home sometimes but not regularly.   BP 139/76 11/08/2015 Weight 175 lb (79 kg) 11/08/2015 Height 63 in (160 cm) 11/08/2015 BMI 31.10 (Obese Class I) 11/08/2015  Lipid Panel completed 02/14/2015 HDL 56.000 02/14/2015 LDL 161.000 02/14/2015 Cholesterol, total 257.000 02/14/2015 Triglycerides 202.000 02/14/2015 A1C N/D Glucose Random 106.000 02/14/2015  Sleep Apnea Night time mouth piece:  obtained through Orthodontics services to assist with sleep apnea management due to patient unable to tolerate the CPAP (2016).   Patient feels that she was unable to tolerate the CPAP due to past experience 03/2014 when she had to have a tracheotomy for urgent needs.  H/o followed up with MD and treated for allergies; sent home with Rx for nasal spray.  Was not getting  any better and was using a vaporizer with no improvement.  Called 911 and had swollen glands causing difficulty breathing; gave breathing treatment but was not able to breath requiring tracheotomy and hospital admission for about 6 days.  Patient reports no  breathing issues and only required tracheostomy for acute / urgent needs.   Medications:   Co-pay cost issues: h/o some medication recommendations that patient would not buy due to cost.  Patient gave example as Voltaren and another med that cost $1,300.00 that she is not able to provide the med name.  Prevnar (PCV13) Va 10/05/2013 Pneumovax (PPSV2 12/10/2001 Flu Vaccine 08/29/2015 tDAP Vaccine 6/19/201 Patient confirms she held the Tumeric to better observe for any relation to nausea and rule out association to Cymbalta.  Patient resumed Cymbalta   Encounter Medications:  Outpatient Encounter Prescriptions as of 12/08/2015  Medication Sig Note  . ALPRAZolam (XANAX) 0.5 MG tablet Take 0.25-0.5 mg by mouth at bedtime as needed for anxiety. 10/06/2015: Taking for sleep on occasion only.     Marland Kitchen. amLODipine (NORVASC) 5 MG tablet Take 5 mg by mouth every morning. 03/20/2014: Patient is prescribed to take this medication: 10 mg daily, but she made the change herself to 5 mg daily  because she was tired of taking so many medications.    . clindamycin (CLEOCIN) 150 MG capsule Take 1 capsule (150 mg total) by mouth 3 (three) times daily. (Patient not taking: Reported on 11/16/2015)   . dexamethasone (DECADRON) 2 MG tablet Take 1 tablet (2 mg total) by mouth daily.   . DULoxetine (CYMBALTA) 30 MG capsule take 1 capsule by mouth once daily   . levothyroxine (SYNTHROID, LEVOTHROID) 100 MCG tablet Take 100 mcg by mouth daily before breakfast. [facility]   . LORazepam (ATIVAN) 1 MG tablet Take 1 tablet (1 mg total) by mouth every 12 (twelve) hours as needed for anxiety. (Patient not taking: Reported on 11/16/2015)   . losartan (COZAAR) 50 MG tablet Take 50 mg by mouth daily. 03/20/2014: Patient is prescribed to take this medication: 100 mg daily, but she made the change herself to 50 mg daily  because she was tired of taking so many medications.  . metoprolol (LOPRESSOR) 50 MG tablet Take 50 mg by mouth every  morning.   . potassium chloride 20 MEQ TBCR Take 20 mEq by mouth daily.   . sertraline (ZOLOFT) 100 MG tablet Take 100 mg by mouth every morning.    Facility-Administered Encounter Medications as of 12/08/2015  Medication  . lidocaine (PF) (XYLOCAINE) 1 % injection 0.3 mL  . methylPREDNISolone acetate (DEPO-MEDROL) injection 80 mg    Functional Status:  In your present state of health, do you have any difficulty performing the following activities: 10/06/2015  Hearing? N  Vision? Y  Difficulty concentrating or making decisions? N  Walking or climbing stairs? Y  Dressing or bathing? N  Doing errands, shopping? N  Preparing Food and eating ? N  Using the Toilet? N  In the past six months, have you accidently leaked urine? N  Do you have problems with loss of bowel control? N  Managing your Medications? N  Managing your Finances? N  Housekeeping or managing your Housekeeping? N  Some recent data might be hidden    Fall/Depression Screening: PHQ 2/9 Scores 10/05/2015  PHQ - 2 Score 1   Fall Risk  11/08/2015 10/06/2015  Falls in the past year? No No    Preventives: Hearing:  had screening in [employer] and  was advised in some changes noted in the left ear.  Patient states she has noticed a change in her ability to understand which may or may not be associated to her hearing.  Patient states she prefers to hold off on a screening appt for now.  Eyes:  Dr. (patient unable to provide name)   - [facility] [address]   -Routine eye appt 07/2015 -Cataract surgery,  [facility]. Hazle Quant[facility]:  1st eye completed 11/27 and 2nd eye scheduled for 12/26/15.  Friend providing transportation.  Dentist:  H/o last appt 1 and 1/2 years ago. Patient has a Left lower bridge which needs replacing.   Patient is searching for a new dentist; wants to locate a in-network provider with [facility].  States she has coverage for dental cleaning.   States she was on the phone with [employer] 1 and 1/2 hours and all  the numbers and MD names provided were not working, moved or out of network. Podiatrist:  Using ortho shoe inserts in  tennis shoes for about approximately 4-5 years but not wearing all the time.  Mammogram:  09/14/2015  Bone Density: none in a long time. Patient does take Calcium Colonoscopy:  10/31/2009    Assessment / Plan:  Referral:  09/21/2015 Telephonic Screening and Initial Assessment:  10/05/15 Telephonic RN CM Services: 10/05/15 Program:  HTN 10/05/15   HTN -RN CM advised to start self checking home BP more regularly. [employer] Educational Materials mailed 10/05/15 (reviewed 12/08/2015) -High Blood Pressure (Hypertension):  Health Problems -High Blood Pressure (Hypertension): What You Can Do -Keeping An Eye On Your Blood Pressure -Medications For High Blood Pressure (Hypertension) -Low-Salt Diet -Advance Directive Document -OSA [employer] Education materials.   Medication management: RN CM will contact Dr. Rayburn Ma to request refill on Cymbalta.   RN CM will follow-up on Benefits  -Research for massage therapy -Dentist needed  [facility] cleaning appt needed     [facility] Social Work Referral sent 10/05/15 -[facility] [employer] and Activities mailed 10/10/15 by [facility] Care Management Assistant.   RN CM advised in next [facility] scheduled contact call within next 30 days for monthly assessment and care coordination services as needed. RN CM advised to please notify MD of any changes in condition prior to scheduled appt's.   RN CM provided contact name and # [phone] or main office # [phone] and 24-hour nurse line # [phone].  RN CM confirmed patient is aware of 911 services for urgent emergency needs.   Simmie Davies, BSN, RN, CCM  [facility] [facility] Care Management Coordinator [phone] Direct [phone] Cell [phone] Office [phone] Fax [email or web address]

## 2015-12-08 NOTE — Telephone Encounter (Signed)
Please advise 

## 2015-12-09 ENCOUNTER — Other Ambulatory Visit: Payer: Self-pay

## 2015-12-09 NOTE — Patient Outreach (Signed)
[facility]) Care Management  12/08/2015 Gabrielle Whitney 05/26/1933 [id number]   Telephonic Care Coordination Services  Contact call to Dr. Rayburn MaBlackmon.   Issue:  Patient needs refill called on Cymbalta.  Patient out of med 3-4 days.   Pain:  Hips and back associated to bursitis.  Patient  had 2 injections into hip for bursitis management prior to October 2017 and states noting improvement in pain management since starting the Duloxetine 30 mg every day.  Plan:  MD office to call update when refill called to pharmacy.    Simmie Daviesrystal Aleiyah Halpin, MSHL, BSN, RN, CCM  [facility] Care Management Care Management Coordinator [phone] Direct [phone] Cell [phone] Office [phone] Fax Per [email or web address]

## 2015-12-12 NOTE — Telephone Encounter (Signed)
Pt come into the office asking about Cymbalta meds, she has been out for 10 days. [facility] so pharmacy put original rx sent back on the shelf because it was 30 days only-please call miss Malen GauzeFoster when this is taken care of. P#[phone]

## 2015-12-13 NOTE — Telephone Encounter (Signed)
done

## 2015-12-26 DIAGNOSIS — H2511 Age-related nuclear cataract, right eye: Secondary | ICD-10-CM | POA: Diagnosis not present

## 2015-12-26 DIAGNOSIS — H25811 Combined forms of age-related cataract, right eye: Secondary | ICD-10-CM | POA: Diagnosis not present

## 2015-12-27 ENCOUNTER — Ambulatory Visit: Payer: Self-pay

## 2015-12-28 ENCOUNTER — Ambulatory Visit: Payer: Self-pay

## 2015-12-29 ENCOUNTER — Ambulatory Visit: Payer: Self-pay

## 2016-01-03 ENCOUNTER — Ambulatory Visit: Payer: Self-pay

## 2016-01-03 ENCOUNTER — Other Ambulatory Visit: Payer: Self-pay

## 2016-01-03 NOTE — Patient Outreach (Signed)
[facility]) Care Management  01/03/2016  Gabrielle Whitney 12/13/1933 [id number]   Telephonic Monthly Assessment  Program:  HTN  10/05/15 Insurance:  [employer] Medicare  Outreach call #1 to patient. Patient not reached.  RN CM left HIPAA compliant voice message with name and number. RN CM scheduled for next contact call within one week.    [name], MSHL, BSN, RN, CCM  [facility] Care Management Care Management Coordinator [phone] Direct [phone] Cell [phone] Office [phone] Fax [email or web address]

## 2016-01-04 ENCOUNTER — Ambulatory Visit: Payer: Self-pay

## 2016-01-04 ENCOUNTER — Ambulatory Visit: Payer: Commercial Managed Care - HMO

## 2016-01-05 ENCOUNTER — Other Ambulatory Visit: Payer: Self-pay

## 2016-01-05 NOTE — Patient Outreach (Addendum)
[facility]) Care Management  01/05/2016  Gabrielle Whitney 09/19/1933 [id number]   Telephonic Monthly Assessment   Referral:  09/21/2015 Telephonic Screening and Initial Assessment:  10/05/15 Telephonic RN CM Services: 10/05/15 Program:  HTN 10/05/15  Insurance:  [employer] Medicare  Outreach call #2 to patient. Patient not reached.  RN CM left HIPAA compliant voice message with name and number. RN CM scheduled for next contact call within one week.   Simmie Daviesrystal Taneshia Lorence, MSHL, BSN, RN, CCM  [facility] Care Management Care Management Coordinator [phone] Direct [phone] Cell [phone] Office [phone] Fax [email or web address]

## 2016-01-10 ENCOUNTER — Ambulatory Visit: Payer: Self-pay

## 2016-01-11 ENCOUNTER — Ambulatory Visit: Payer: Self-pay

## 2016-01-12 ENCOUNTER — Ambulatory Visit: Payer: Self-pay

## 2016-01-13 ENCOUNTER — Ambulatory Visit: Payer: Self-pay

## 2016-01-16 ENCOUNTER — Other Ambulatory Visit: Payer: Self-pay

## 2016-01-16 NOTE — Patient Outreach (Signed)
[facility]) Care Management  01/16/2016  Gabrielle Whitney 02/06/1933 [id number]   Telephonic Monthly Assessment   Referral:  09/21/2015 Telephonic Screening and Initial Assessment:  10/05/15 Telephonic RN CM Services: 10/05/15 Program:  HTN 10/05/15  Insurance:  [employer] Medicare  Outreach call #3 to patient. Patient not reached.  RN CM left HIPAA compliant voice message with name and number. RN CM mailed unsuccessful outreach letter and will close case if no response within next 2 weeks.   Simmie Daviesrystal Bria Portales, MSHL, BSN, RN, CCM  [facility] Care Management Care Management Coordinator [phone] Direct [phone] Cell [phone] Office [phone] Fax [email or web address]

## 2016-01-30 ENCOUNTER — Ambulatory Visit: Payer: Self-pay

## 2016-01-30 DIAGNOSIS — L659 Nonscarring hair loss, unspecified: Secondary | ICD-10-CM | POA: Diagnosis not present

## 2016-01-30 DIAGNOSIS — R42 Dizziness and giddiness: Secondary | ICD-10-CM | POA: Diagnosis not present

## 2016-01-30 DIAGNOSIS — I10 Essential (primary) hypertension: Secondary | ICD-10-CM | POA: Diagnosis not present

## 2016-01-30 DIAGNOSIS — E039 Hypothyroidism, unspecified: Secondary | ICD-10-CM | POA: Diagnosis not present

## 2016-01-30 DIAGNOSIS — E782 Mixed hyperlipidemia: Secondary | ICD-10-CM | POA: Diagnosis not present

## 2016-01-30 DIAGNOSIS — H6590 Unspecified nonsuppurative otitis media, unspecified ear: Secondary | ICD-10-CM | POA: Diagnosis not present

## 2016-01-31 ENCOUNTER — Ambulatory Visit: Payer: Self-pay

## 2016-02-01 ENCOUNTER — Ambulatory Visit: Payer: Self-pay

## 2016-02-01 ENCOUNTER — Other Ambulatory Visit: Payer: Self-pay

## 2016-02-01 NOTE — Patient Outreach (Signed)
[facility]) Care Management  02/01/2016  Gabrielle Whitney 08/25/1933 [id number]   Case Closure  Patient unable to reach with several phone attempts and no response received from unsuccessful outreach letter.  Patient and PCP notified via case closure letter [facility] notified.    Simmie Daviesrystal Lainey Nelson, MSHL, BSN, RN, CCM  [facility] Care Management Care Management Coordinator [phone] Direct [phone] Cell [phone] Office [phone] Fax [email or web address]

## 2016-02-02 DIAGNOSIS — H2512 Age-related nuclear cataract, left eye: Secondary | ICD-10-CM | POA: Diagnosis not present

## 2016-02-02 DIAGNOSIS — Z961 Presence of intraocular lens: Secondary | ICD-10-CM | POA: Diagnosis not present

## 2016-02-02 DIAGNOSIS — H2511 Age-related nuclear cataract, right eye: Secondary | ICD-10-CM | POA: Diagnosis not present

## 2016-02-20 DIAGNOSIS — Z0000 Encounter for general adult medical examination without abnormal findings: Secondary | ICD-10-CM | POA: Diagnosis not present

## 2016-02-20 DIAGNOSIS — F329 Major depressive disorder, single episode, unspecified: Secondary | ICD-10-CM | POA: Diagnosis not present

## 2016-02-20 DIAGNOSIS — G4700 Insomnia, unspecified: Secondary | ICD-10-CM | POA: Diagnosis not present

## 2016-02-20 DIAGNOSIS — E785 Hyperlipidemia, unspecified: Secondary | ICD-10-CM | POA: Diagnosis not present

## 2016-02-20 DIAGNOSIS — G479 Sleep disorder, unspecified: Secondary | ICD-10-CM | POA: Diagnosis not present

## 2016-02-20 DIAGNOSIS — I10 Essential (primary) hypertension: Secondary | ICD-10-CM | POA: Diagnosis not present

## 2016-02-20 DIAGNOSIS — E782 Mixed hyperlipidemia: Secondary | ICD-10-CM | POA: Diagnosis not present

## 2016-02-20 DIAGNOSIS — F418 Other specified anxiety disorders: Secondary | ICD-10-CM | POA: Diagnosis not present

## 2016-02-20 DIAGNOSIS — E039 Hypothyroidism, unspecified: Secondary | ICD-10-CM | POA: Diagnosis not present

## 2016-03-07 ENCOUNTER — Ambulatory Visit: Payer: Commercial Managed Care - HMO

## 2016-03-12 DIAGNOSIS — H02834 Dermatochalasis of left upper eyelid: Secondary | ICD-10-CM | POA: Diagnosis not present

## 2016-03-12 DIAGNOSIS — H02831 Dermatochalasis of right upper eyelid: Secondary | ICD-10-CM | POA: Diagnosis not present

## 2016-04-04 ENCOUNTER — Ambulatory Visit: Payer: Commercial Managed Care - HMO

## 2016-05-01 ENCOUNTER — Telehealth (INDEPENDENT_AMBULATORY_CARE_PROVIDER_SITE_OTHER): Payer: Self-pay | Admitting: Orthopaedic Surgery

## 2016-05-01 NOTE — Telephone Encounter (Signed)
Patient would like to pick up a copy of her MRI results for another appointment she has scheduled for Thursday. CB # [phone]

## 2016-05-01 NOTE — Telephone Encounter (Signed)
LMOM for patient letting her know I printed off her 2016 MRI and put at the front desk

## 2016-05-03 DIAGNOSIS — Z961 Presence of intraocular lens: Secondary | ICD-10-CM | POA: Diagnosis not present

## 2016-05-03 DIAGNOSIS — H26493 Other secondary cataract, bilateral: Secondary | ICD-10-CM | POA: Diagnosis not present

## 2016-05-11 DIAGNOSIS — H02834 Dermatochalasis of left upper eyelid: Secondary | ICD-10-CM | POA: Diagnosis not present

## 2016-05-11 DIAGNOSIS — H02831 Dermatochalasis of right upper eyelid: Secondary | ICD-10-CM | POA: Diagnosis not present

## 2016-09-27 ENCOUNTER — Encounter: Payer: Self-pay | Admitting: Gastroenterology

## 2016-11-08 DIAGNOSIS — H35033 Hypertensive retinopathy, bilateral: Secondary | ICD-10-CM | POA: Diagnosis not present

## 2016-11-08 DIAGNOSIS — H26492 Other secondary cataract, left eye: Secondary | ICD-10-CM | POA: Diagnosis not present

## 2016-11-08 DIAGNOSIS — H3561 Retinal hemorrhage, right eye: Secondary | ICD-10-CM | POA: Diagnosis not present

## 2016-11-08 DIAGNOSIS — Z961 Presence of intraocular lens: Secondary | ICD-10-CM | POA: Diagnosis not present

## 2017-01-10 DIAGNOSIS — H26491 Other secondary cataract, right eye: Secondary | ICD-10-CM | POA: Diagnosis not present

## 2017-02-22 DIAGNOSIS — F322 Major depressive disorder, single episode, severe without psychotic features: Secondary | ICD-10-CM | POA: Diagnosis not present

## 2017-02-22 DIAGNOSIS — K219 Gastro-esophageal reflux disease without esophagitis: Secondary | ICD-10-CM | POA: Diagnosis not present

## 2017-02-22 DIAGNOSIS — E782 Mixed hyperlipidemia: Secondary | ICD-10-CM | POA: Diagnosis not present

## 2017-02-22 DIAGNOSIS — Z1389 Encounter for screening for other disorder: Secondary | ICD-10-CM | POA: Diagnosis not present

## 2017-02-22 DIAGNOSIS — E039 Hypothyroidism, unspecified: Secondary | ICD-10-CM | POA: Diagnosis not present

## 2017-02-22 DIAGNOSIS — Z0000 Encounter for general adult medical examination without abnormal findings: Secondary | ICD-10-CM | POA: Diagnosis not present

## 2017-02-22 DIAGNOSIS — R32 Unspecified urinary incontinence: Secondary | ICD-10-CM | POA: Diagnosis not present

## 2017-02-22 DIAGNOSIS — I10 Essential (primary) hypertension: Secondary | ICD-10-CM | POA: Diagnosis not present

## 2017-02-22 DIAGNOSIS — G4733 Obstructive sleep apnea (adult) (pediatric): Secondary | ICD-10-CM | POA: Diagnosis not present

## 2017-03-06 ENCOUNTER — Other Ambulatory Visit: Payer: Self-pay

## 2017-03-06 ENCOUNTER — Emergency Department (HOSPITAL_COMMUNITY)
Admission: EM | Admit: 2017-03-06 | Discharge: 2017-03-07 | Disposition: A | Discharge status: Home / Self Care | Payer: Medicare HMO | Attending: Emergency Medicine | Admitting: Emergency Medicine

## 2017-03-06 ENCOUNTER — Emergency Department (HOSPITAL_COMMUNITY): Payer: Medicare HMO

## 2017-03-06 ENCOUNTER — Encounter (HOSPITAL_COMMUNITY): Payer: Self-pay | Admitting: Emergency Medicine

## 2017-03-06 DIAGNOSIS — E039 Hypothyroidism, unspecified: Secondary | ICD-10-CM | POA: Diagnosis not present

## 2017-03-06 DIAGNOSIS — Z79899 Other long term (current) drug therapy: Secondary | ICD-10-CM | POA: Diagnosis not present

## 2017-03-06 DIAGNOSIS — W19XXXA Unspecified fall, initial encounter: Secondary | ICD-10-CM

## 2017-03-06 DIAGNOSIS — R2243 Localized swelling, mass and lump, lower limb, bilateral: Secondary | ICD-10-CM | POA: Insufficient documentation

## 2017-03-06 DIAGNOSIS — R0781 Pleurodynia: Secondary | ICD-10-CM | POA: Diagnosis not present

## 2017-03-06 DIAGNOSIS — M546 Pain in thoracic spine: Secondary | ICD-10-CM | POA: Diagnosis not present

## 2017-03-06 DIAGNOSIS — I10 Essential (primary) hypertension: Secondary | ICD-10-CM | POA: Insufficient documentation

## 2017-03-06 DIAGNOSIS — M25512 Pain in left shoulder: Secondary | ICD-10-CM | POA: Diagnosis not present

## 2017-03-06 DIAGNOSIS — R079 Chest pain, unspecified: Secondary | ICD-10-CM | POA: Diagnosis not present

## 2017-03-06 DIAGNOSIS — Y998 Other external cause status: Secondary | ICD-10-CM | POA: Insufficient documentation

## 2017-03-06 DIAGNOSIS — W108XXA Fall (on) (from) other stairs and steps, initial encounter: Secondary | ICD-10-CM | POA: Insufficient documentation

## 2017-03-06 DIAGNOSIS — Y9301 Activity, walking, marching and hiking: Secondary | ICD-10-CM | POA: Diagnosis not present

## 2017-03-06 DIAGNOSIS — M4854XA Collapsed vertebra, not elsewhere classified, thoracic region, initial encounter for fracture: Secondary | ICD-10-CM

## 2017-03-06 DIAGNOSIS — Y929 Unspecified place or not applicable: Secondary | ICD-10-CM | POA: Diagnosis not present

## 2017-03-06 DIAGNOSIS — S299XXA Unspecified injury of thorax, initial encounter: Secondary | ICD-10-CM | POA: Diagnosis not present

## 2017-03-06 DIAGNOSIS — M8448XA Pathological fracture, other site, initial encounter for fracture: Secondary | ICD-10-CM | POA: Diagnosis not present

## 2017-03-06 HISTORY — DX: Insomnia, unspecified: G47.00

## 2017-03-06 LAB — CBC WITH DIFFERENTIAL/PLATELET
Basophils Absolute: 0 10*3/uL (ref 0.0–0.1)
Basophils Relative: 0 %
EOS ABS: 0 10*3/uL (ref 0.0–0.7)
EOS PCT: 0 %
HCT: 41.6 % (ref 36.0–46.0)
Hemoglobin: 14.5 g/dL (ref 12.0–15.0)
LYMPHS ABS: 1.4 10*3/uL (ref 0.7–4.0)
Lymphocytes Relative: 12 %
MCH: 33.3 pg (ref 26.0–34.0)
MCHC: 34.9 g/dL (ref 30.0–36.0)
MCV: 95.6 fL (ref 78.0–100.0)
MONOS PCT: 8 %
Monocytes Absolute: 0.9 10*3/uL (ref 0.1–1.0)
Neutro Abs: 9.4 10*3/uL — ABNORMAL HIGH (ref 1.7–7.7)
Neutrophils Relative %: 80 %
PLATELETS: 303 10*3/uL (ref 150–400)
RBC: 4.35 MIL/uL (ref 3.87–5.11)
RDW: 12.2 % (ref 11.5–15.5)
WBC: 11.8 10*3/uL — AB (ref 4.0–10.5)

## 2017-03-06 LAB — COMPREHENSIVE METABOLIC PANEL
ALT: 25 U/L (ref 14–54)
ANION GAP: 13 (ref 5–15)
AST: 26 U/L (ref 15–41)
Albumin: 4.4 g/dL (ref 3.5–5.0)
Alkaline Phosphatase: 63 U/L (ref 38–126)
BUN: 25 mg/dL — ABNORMAL HIGH (ref 6–20)
CHLORIDE: 100 mmol/L — AB (ref 101–111)
CO2: 24 mmol/L (ref 22–32)
Calcium: 10.6 mg/dL — ABNORMAL HIGH (ref 8.9–10.3)
Creatinine, Ser: 1.19 mg/dL — ABNORMAL HIGH (ref 0.44–1.00)
GFR calc non Af Amer: 42 mL/min — ABNORMAL LOW (ref >60)
GFR, EST AFRICAN AMERICAN: 48 mL/min — AB (ref >60)
Glucose, Bld: 132 mg/dL — ABNORMAL HIGH (ref 65–99)
Potassium: 4.5 mmol/L (ref 3.5–5.1)
SODIUM: 137 mmol/L (ref 135–145)
Total Bilirubin: 0.5 mg/dL (ref 0.3–1.2)
Total Protein: 7.3 g/dL (ref 6.5–8.1)

## 2017-03-06 LAB — I-STAT TROPONIN, ED: Troponin i, poc: 0 ng/mL (ref 0.00–0.08)

## 2017-03-06 MED ORDER — SODIUM CHLORIDE 0.9 % IJ SOLN
INTRAMUSCULAR | Status: AC
  Filled 2017-03-06: qty 50

## 2017-03-06 MED ORDER — IOPAMIDOL (ISOVUE-370) INJECTION 76%
INTRAVENOUS | Status: AC
  Administered 2017-03-06: 80 mL
  Filled 2017-03-06: qty 100

## 2017-03-06 NOTE — ED Notes (Signed)
No respiratory or acute distress noted alert and talking call light in reach. 

## 2017-03-06 NOTE — ED Triage Notes (Signed)
[facility] after having fall today. Pt had chest xray due to falling onto chest, no abnormalities. Pt has had another fall recently so [facility] sent for CT scan and further evaluation.

## 2017-03-06 NOTE — ED Notes (Signed)
Patient went to restroom before coming into triage.

## 2017-03-07 ENCOUNTER — Ambulatory Visit (HOSPITAL_COMMUNITY): Payer: Medicare HMO

## 2017-03-07 DIAGNOSIS — E878 Other disorders of electrolyte and fluid balance, not elsewhere classified: Secondary | ICD-10-CM | POA: Diagnosis not present

## 2017-03-07 DIAGNOSIS — R296 Repeated falls: Secondary | ICD-10-CM | POA: Diagnosis not present

## 2017-03-07 DIAGNOSIS — R079 Chest pain, unspecified: Secondary | ICD-10-CM | POA: Diagnosis not present

## 2017-03-07 DIAGNOSIS — R9389 Abnormal findings on diagnostic imaging of other specified body structures: Secondary | ICD-10-CM | POA: Diagnosis not present

## 2017-03-07 DIAGNOSIS — E039 Hypothyroidism, unspecified: Secondary | ICD-10-CM | POA: Diagnosis not present

## 2017-03-07 DIAGNOSIS — R42 Dizziness and giddiness: Secondary | ICD-10-CM | POA: Diagnosis not present

## 2017-03-07 DIAGNOSIS — S22000A Wedge compression fracture of unspecified thoracic vertebra, initial encounter for closed fracture: Secondary | ICD-10-CM | POA: Diagnosis not present

## 2017-03-07 LAB — URINALYSIS, ROUTINE W REFLEX MICROSCOPIC
BACTERIA UA: NONE SEEN
BILIRUBIN URINE: NEGATIVE
Glucose, UA: NEGATIVE mg/dL
KETONES UR: NEGATIVE mg/dL
Nitrite: NEGATIVE
PH: 6 (ref 5.0–8.0)
PROTEIN: NEGATIVE mg/dL
Specific Gravity, Urine: 1.025 (ref 1.005–1.030)

## 2017-03-07 LAB — TROPONIN I: Troponin I: <0.03 ng/mL (ref <0.03)

## 2017-03-07 MED ORDER — SODIUM CHLORIDE 0.9 % IV BOLUS (SEPSIS)
1000.0000 mL | Freq: Once | INTRAVENOUS | Status: AC
  Administered 2017-03-07: 1000 mL via INTRAVENOUS

## 2017-03-07 NOTE — Discharge Instructions (Addendum)
You have a lung nodule which we recommend follow up for through your primary care physician with Radiology recommendations below:  6 x 5 mm right lower lobe subpleural nodule or protrusion of chest wall fat tissue. Consider one of the following in 3 months for both low-risk and high-risk individuals: (a) repeat chest CT, (b) follow-up PET-CT, or (c) tissue sampling. This recommendation follows the consensus statement: Guidelines for Management of Incidental Pulmonary Nodules Detected on CT Images: From the Fleischner Society 2017; Radiology 2017; 284:228-243.

## 2017-03-07 NOTE — ED Notes (Signed)
No respiratory or acute distress noted alert and oriented x 3 no pain voiced call light in reach resting in bed.

## 2017-03-07 NOTE — ED Provider Notes (Signed)
[facility]-EMERGENCY DEPT Provider Note   CSN: [id number] Arrival date & time: 03/06/17  2023     History   Chief Complaint Chief Complaint  Patient presents with  . Fall    HPI Gabrielle Whitney is a 84 y.o. female.  HPI   84 year old female with a history of hypertension, hyperlipidemia who presents with concern for mechanical fall up the stairs and chest pain.  Patient reports that she was trying to walk up the stairs earlier today, and tripped falling forwards.  Reports it happened so quickly that she is not sure exactly how she landed, but does not feel that she hit her chest.  Denies head trauma, headache, neck pain, midline back pain, numbness or weakness.  Reports that since the fall, she has had chest pain, located in the left upper chest with radiation into the shoulder.  Reports it is not worse with palpation of the chest, does not feel that she fell onto her chest.  Reports it is worsened with changes of position, and deep breaths and improved with other positions.  It feels like an aching pain, like she is sore and hurt it. denies shortness of breath, nausea, vomiting, diaphoresis, lightheadedness.  Reports for several months, she has had lower extremity edema, left greater than right.  Reports that it will come and go, when she seen her primary care physician it was not present, so she had not had any other studies done on it, including ultrasound.  Denies any significant pain in that area.  Patient denies any other areas of pain from the fall.  XR was done at PCP without acute findings, however she was continuing to have pain and came for further eval for rib fractures.   Past Medical History:  Diagnosis Date  . Depression   . Difficult intubation   . GERD (gastroesophageal reflux disease)   . Hyperlipidemia   . Hypertension   . Hypothyroidism    hypothyroidism  . Insomnia   . Osteoporosis     Patient Active Problem List   Diagnosis Date Noted  .  Group A streptococcal infection 03/23/2014  . Essential hypertension 03/23/2014  . Hypothyroidism 03/23/2014  . Depression 03/23/2014  . Hypokalemia 03/23/2014  . Tracheostomy in place [facility]) 03/23/2014  . Respiratory compromise 03/23/2014  . Laryngeal edema 03/23/2014  . Neck abscess 03/20/2014    Past Surgical History:  Procedure Laterality Date  . TRACHEOSTOMY TUBE PLACEMENT N/A 03/20/2014   Procedure: AWAKE INTUBATION, TRACHEOSTOMY WITH DRAINAGE OF ABSCESS;  Surgeon: Newman Pies, MD;  Location: [facility] ORS;  Service: ENT;  Laterality: N/A;    OB History    No data available       Home Medications    Prior to Admission medications   Medication Sig Start Date End Date Taking? Authorizing Provider  amLODipine (NORVASC) 5 MG tablet Take 5 mg by mouth every morning.   Yes [provider]  DULoxetine (CYMBALTA) 30 MG capsule take 1 capsule by mouth once daily 12/06/15  Yes Kathryne Hitch, MD  levothyroxine (SYNTHROID, LEVOTHROID) 100 MCG tablet Take 100 mcg by mouth daily before breakfast.   Yes [provider]  losartan (COZAAR) 50 MG tablet Take 50 mg by mouth daily.   Yes [provider]  metoprolol (LOPRESSOR) 50 MG tablet Take 50 mg by mouth every morning.   Yes [provider]  potassium chloride 20 MEQ TBCR Take 20 mEq by mouth daily. 03/25/14  Yes Alison Murray, MD  clindamycin (CLEOCIN) 150 MG capsule Take 1 capsule (150 mg total) by mouth 3 (three) times daily. Patient not taking: Reported on 12/08/2015 03/25/14   Alison Murray, MD  dexamethasone (DECADRON) 2 MG tablet Take 1 tablet (2 mg total) by mouth daily. Patient not taking: Reported on 12/08/2015 03/25/14   Alison Murray, MD  levothyroxine Erline Levine, LEVOTHROID) 112 MCG tablet  02/27/17   [provider]  LORazepam (ATIVAN) 1 MG tablet Take 1 tablet (1 mg total) by mouth every 12 (twelve) hours as needed for anxiety. Patient not taking: Reported on 12/08/2015 03/25/14    Alison Murray, MD    Family History No family history on file.  Social History Social History   Tobacco Use  . Smoking status: Never Smoker  . Smokeless tobacco: Never Used  Substance Use Topics  . Alcohol use: No    Alcohol/week: 0.0 oz  . Drug use: No     Allergies   Alprazolam; Statins; Welchol [colesevelam hcl]; and Zocor [simvastatin]   Review of Systems Review of Systems  Constitutional: Negative for fever.  HENT: Negative for sore throat.   Eyes: Negative for visual disturbance.  Respiratory: Negative for cough and shortness of breath.   Cardiovascular: Positive for chest pain and leg swelling.  Gastrointestinal: Negative for abdominal pain, nausea and vomiting.  Genitourinary: Negative for difficulty urinating.  Musculoskeletal: Positive for arthralgias and back pain (left upper back/shoulder). Negative for neck pain.  Skin: Negative for rash.  Neurological: Negative for syncope and headaches.     Physical Exam Updated Vital Signs BP 132/84   Pulse 91   Temp 97.8 F (36.6 C) (Oral)   Resp 16   SpO2 96%   Physical Exam  Constitutional: She is oriented to person, place, and time. She appears well-developed and well-nourished. No distress.  HENT:  Head: Normocephalic and atraumatic.  Eyes: Conjunctivae and EOM are normal.  Neck: Normal range of motion.  Cardiovascular: Normal rate, regular rhythm, normal heart sounds and intact distal pulses. Exam reveals no gallop and no friction rub.  No murmur heard. Pulmonary/Chest: Effort normal and breath sounds normal. No respiratory distress. She has no wheezes. She has no rales. She exhibits no tenderness.  Abdominal: Soft. She exhibits no distension. There is no tenderness. There is no guarding.  Musculoskeletal: She exhibits no edema.       Cervical back: She exhibits no bony tenderness.       Thoracic back: She exhibits tenderness (left paraspinal, left shoulder/trapezius). She exhibits no bony tenderness.    Neurological: She is alert and oriented to person, place, and time.  Skin: Skin is warm and dry. No rash noted. She is not diaphoretic. No erythema.  Nursing note and vitals reviewed.    ED Treatments / Results  Labs (all labs ordered are listed, but only abnormal results are displayed) Labs Reviewed  CBC WITH DIFFERENTIAL/PLATELET - Abnormal; Notable for the following components:      Result Value   WBC 11.8 (*)    Neutro Abs 9.4 (*)    All other components within normal limits  COMPREHENSIVE METABOLIC PANEL - Abnormal; Notable for the following components:   Chloride 100 (*)    Glucose, Bld 132 (*)    BUN 25 (*)    Creatinine, Ser 1.19 (*)    Calcium 10.6 (*)    GFR calc non Af Amer 42 (*)    GFR calc Af Amer 48 (*)    All other components within normal  limits  TROPONIN I  I-STAT TROPONIN, ED  I-STAT TROPONIN, ED    EKG  EKG Interpretation  Date/Time:  Wednesday March 06 2017 21:43:06 EST Ventricular Rate:  84 PR Interval:    QRS Duration: 104 QT Interval:  380 QTC Calculation: 450 R Axis:   -53 Text Interpretation:  Sinus rhythm Abnormal R-wave progression, late transition Inferior infarct, old No previous ECGs available Confirmed by Alvira Monday ([id number]) on 03/06/2017 10:20:54 PM       Radiology Ct Angio Chest Pe W And/or Wo Contrast  Result Date: 03/06/2017 CLINICAL DATA:  84 year old female with chest trauma. EXAM: CT ANGIOGRAPHY CHEST WITH CONTRAST TECHNIQUE: Multidetector CT imaging of the chest was performed using the standard protocol during bolus administration of intravenous contrast. Multiplanar CT image reconstructions and MIPs were obtained to evaluate the vascular anatomy. CONTRAST:  80mL ISOVUE-370 IOPAMIDOL (ISOVUE-370) INJECTION 76% COMPARISON:  None. FINDINGS: Cardiovascular: Mild cardiomegaly. Coronary vascular calcification. There is no pericardial effusion. Advanced atherosclerotic calcification of the thoracic aorta. No aneurysmal  dilatation or evidence of dissection. The origins of the great vessels of the aortic arch are patent. There is no CT evidence of pulmonary embolism. Mediastinum/Nodes: No hilar or mediastinal adenopathy. Small hiatal hernia. The esophagus is grossly unremarkable. No mediastinal fluid collection. Lungs/Pleura: There are minimal bibasilar atelectasis/scarring as well as interstitial coarsening. There is a 16 x 5 mm subpleural nodular density along the lateral pleural surface of the right lower lobe (series 6, image 85). This appears to demonstrate fat attenuation and likely represents protrusion of intracostal fat. Attention to this area on follow-up imaging recommended. There is no focal consolidation, pleural effusion, or pneumothorax. The central airways are patent. Upper Abdomen: Probable mild fatty liver. The visualized upper abdomen is otherwise unremarkable. Musculoskeletal: There is osteopenia with multilevel degenerative changes of the spine. T4 compression fracture with approximately 50% loss of vertebral body height centrally and anterior wedging age indeterminate. Correlation with clinical exam and point tenderness recommended. No retropulsed fragment. No other acute osseous pathology. Review of the MIP images confirms the above findings. IMPRESSION: 1. Age indeterminate T4 compression fracture. Clinical correlation is recommended. 2. No CT evidence of pulmonary embolism or aortic dissection. 3. Mild cardiomegaly and coronary vascular calcification. 4. A 16 x 5 mm right lower lobe subpleural nodule or protrusion of chest wall fat tissue. Consider one of the following in 3 months for both low-risk and high-risk individuals: (a) repeat chest CT, (b) follow-up PET-CT, or (c) tissue sampling. This recommendation follows the consensus statement: Guidelines for Management of Incidental Pulmonary Nodules Detected on CT Images: From the Fleischner Society 2017; Radiology 2017; 284:228-243. Electronically Signed    By: Elgie Collard M.D.   On: 03/06/2017 23:14    Procedures Procedures (including critical care time)  Medications Ordered in ED Medications  sodium chloride 0.9 % injection (not administered)  sodium chloride 0.9 % bolus 1,000 mL (1,000 mLs Intravenous New Bag/Given 03/07/17 0052)  iopamidol (ISOVUE-370) 76 % injection (80 mLs  Contrast Given 03/06/17 2253)     Initial Impression / Assessment and Plan / ED Course  I have reviewed the triage vital signs and the nursing notes.  Pertinent labs & imaging results that were available during my care of the patient were reviewed by me and considered in my medical decision making (see chart for details).     84 year old female with a history of hypertension, hyperlipidemia who presents with concern for mechanical fall up the stairs and chest pain.  Patient denies  headache, head trauma, loss of consciousness, neck pain, have low suspicion for intracranial or cervical spine injury by history and physical exam.  Some aspects of her chest pain sound musculoskeletal, but others do not.   Given asymmetric leg swelling, fall with lateral back tenderness and pleuritic chest pain, PE study done to evaluate for PE or occult rib fractures.  CT shows age-indeterminate T4 compression fracture.  No midline back pain, given no focal tenderness in the midline on exam, suspect this T4 compression fracture is likely chronic.  She does have some pain to the side which is more likely muscular pain.  Discussed that if she continues to have back pain, would discuss with primary care physician, consider spine follow up, however at this time I suspect chronic compression fx with muscular pain from fall . CT shows no sign of rib fracture and no sign of PE.  Discussed incidental findings of coronary calcifications as well as right subpleural nodule which will need further outpatient follow up.  EKG without prior for comparison, however does not show any significant acute  findings to suggest ischemia.  Initial troponin is negative.  Patient reports improvement of her pain with certain positions urgency department.  Given chest pain is overall atypical, in the setting of a fall, do not feel she requires further inpatient cardiac evaluation if second troponin is negative.   Labs show Cr elevated to 1.19 from prior of .5 3 years ago. Not clear if this is chronic change, however given IV fluids and recommend outpt follow up.  Troponin pending at time of transfer of care.  If negative, she may follow up with PCP, consider cardiology follow up.   Final Clinical Impressions(s) / ED Diagnoses   Final diagnoses:  Chest pain, unspecified type  Fall, initial encounter  Non-traumatic compression fracture of fourth thoracic vertebra, initial encounter [facility](HCC)    ED Discharge Orders        Ordered    LE VENOUS     03/07/17 0115       Alvira MondaySchlossman, Hollis Tuller, MD 03/07/17 [phone]

## 2017-03-07 NOTE — ED Notes (Signed)
No respiratory or acute distress noted alert and oriented x 3 call light in reach. 

## 2017-03-07 NOTE — ED Provider Notes (Signed)
Assumed care from Dr. Dalene SeltzerSchlossman at 1:40 AM. Briefly, the patient is a 84 y.o. female with PMHx of  has a past medical history of Depression, Difficult intubation, GERD (gastroesophageal reflux disease), Hyperlipidemia, Hypertension, Hypothyroidism, Insomnia, and Osteoporosis. here with atypical chest pain after mechanical fall.  Suspect this is due to bracing in the setting of a fall.  EKG nonischemic.  Initial troponin negative.  CT angios obtained and shows age-indeterminate T4 fracture but there is no midline back pain in this area.  Doubtful that this is acute.  No rib fracture on CT.  No pneumothorax or other complication.  Current plan is to follow-up delta troponin and if symptoms are resolved and stable, likely discharge with outpatient follow-up.  Of note, the patient has mild creatinine elevation.  Last creatinine was several years ago.  She is been given fluids here.  Electrolytes are otherwise unremarkable..   Labs Reviewed  CBC WITH DIFFERENTIAL/PLATELET - Abnormal; Notable for the following components:      Result Value   WBC 11.8 (*)    Neutro Abs 9.4 (*)    All other components within normal limits  COMPREHENSIVE METABOLIC PANEL - Abnormal; Notable for the following components:   Chloride 100 (*)    Glucose, Bld 132 (*)    BUN 25 (*)    Creatinine, Ser 1.19 (*)    Calcium 10.6 (*)    GFR calc non Af Amer 42 (*)    GFR calc Af Amer 48 (*)    All other components within normal limits  TROPONIN I  I-STAT TROPONIN, ED  I-STAT TROPONIN, ED    Course of Care: -Delta troponin is undetectable.  Vital signs remained stable.  Patient is without complaints.  Will discharge with outpatient follow-up.  Of note, patient had chronic leg swelling.  CT angios negative.  Given her recent fall and low suspicion for DVT, based on discussion with Dr. Dalene SeltzerSchlossman, will hold on empiric anticoagulation at this time and she has been scheduled for an ultrasound tomorrow.  Patient is aware of this.   Incidental is also discussed with the patient.     Shaune PollackIsaacs, Paydon Carll, MD 03/07/17 [phone]

## 2017-03-08 ENCOUNTER — Ambulatory Visit (HOSPITAL_COMMUNITY)
Admission: RE | Admit: 2017-03-08 | Discharge: 2017-03-08 | Disposition: A | Discharge status: Home / Self Care | Payer: Medicare HMO | Source: Ambulatory Visit | Attending: Emergency Medicine | Admitting: Emergency Medicine

## 2017-03-08 DIAGNOSIS — M7989 Other specified soft tissue disorders: Secondary | ICD-10-CM | POA: Diagnosis not present

## 2017-03-08 DIAGNOSIS — M79609 Pain in unspecified limb: Secondary | ICD-10-CM | POA: Diagnosis not present

## 2017-03-08 NOTE — Progress Notes (Signed)
Left lower extremity venous duplex completed. No evidence of DVT, superficial thrombosis,  Or Baker's cyst. Toma DeitersVirginia Bonnye Halle, RVS 03/08/2017, 3:30 PM

## 2017-03-11 ENCOUNTER — Telehealth: Payer: Self-pay

## 2017-03-11 NOTE — Telephone Encounter (Signed)
REFERRAL SENT TO SCHEDULING FROM DR Juluis RainierELIZABETH BARNES [phone]

## 2017-03-12 NOTE — Progress Notes (Signed)
Cardiology Office Note    Date:  03/13/2017   ID:  Gabrielle Whitney, DOB 08/06/1933, MRN [id number]  PCP:  Gabrielle RainierBarnes, Elizabeth, MD  Cardiologist: Lesleigh NoeHenry W Smith III, MD   No chief complaint on file.   History of Present Illness:  Gabrielle Whitney is a 84 y.o. female referred from Dr. Juluis RainierElizabeth Whitney for recent episode of syncope followed by recurring left precordial and scapula chest discomfort.  Patient's history is somewhat vague.  She is 83 and lives alone.  Over the past 3 weeks she has had 2 episodes of falls which have been difficult to determine if related to syncope.  The most recent episode she ended up on the floor after starting up the stairs in her house.  She ended up on the floor, injured the left cheek had a hematoma in her head.  She injured her left shoulder and also hit her chest.  Shortly after the fall she went out with friends became concerned after she noted ecchymosis around the left eye.  This led to a clinic evaluation.  She is referred here now because of the episodes of falling and concerns about chest discomfort.  The first episode occurred 2 weeks earlier when getting out of her car she suddenly went to the ground.  A friend from the neighborhood helped her up and she slid right down again.  That person is not available but according to the patient she was not unconscious.  The chest discomfort did not start until after she fell.  She characterizes it as a left precordial sensation of pressure that radiates through to her left scapula.  It comes and goes and episodes.  She has three-vessel calcification noted on the CT that was done.    Past Medical History:  Diagnosis Date  . Abscess, neck 03/20/2014  . Depression   . Difficult intubation   . GERD (gastroesophageal reflux disease)   . Group A streptococcal infection 03/23/2014  . Hyperlipidemia   . Hypertension   . Hypothyroidism    hypothyroidism  . Insomnia   . Laryngeal edema 03/23/2014  . Neck abscess  03/20/2014  . Osteoporosis   . Respiratory compromise 03/23/2014  . Tracheostomy in place [facility](HCC) 03/23/2014    Past Surgical History:  Procedure Laterality Date  . TRACHEOSTOMY TUBE PLACEMENT N/A 03/20/2014   Procedure: AWAKE INTUBATION, TRACHEOSTOMY WITH DRAINAGE OF ABSCESS;  Surgeon: Newman PiesSu Teoh, MD;  Location: [facility] ORS;  Service: ENT;  Laterality: N/A;    Current Medications: Outpatient Medications Prior to Visit  Medication Sig Dispense Refill  . amLODipine (NORVASC) 5 MG tablet Take 5 mg by mouth every morning.    Marland Kitchen. dexamethasone (DECADRON) 2 MG tablet Take 1 tablet (2 mg total) by mouth daily. 5 tablet 0  . DULoxetine (CYMBALTA) 30 MG capsule take 1 capsule by mouth once daily 30 capsule 0  . levothyroxine (SYNTHROID, LEVOTHROID) 112 MCG tablet     . losartan (COZAAR) 50 MG tablet Take 50 mg by mouth daily.    . metoprolol (LOPRESSOR) 50 MG tablet Take 50 mg by mouth every morning.    . clindamycin (CLEOCIN) 150 MG capsule Take 1 capsule (150 mg total) by mouth 3 (three) times daily. (Patient not taking: Reported on 12/08/2015) 21 capsule 0  . levothyroxine (SYNTHROID, LEVOTHROID) 100 MCG tablet Take 100 mcg by mouth daily before breakfast.    . LORazepam (ATIVAN) 1 MG tablet Take 1 tablet (1 mg total) by mouth every 12 (twelve) hours as  needed for anxiety. (Patient not taking: Reported on 12/08/2015) 10 tablet 0  . potassium chloride 20 MEQ TBCR Take 20 mEq by mouth daily. (Patient not taking: Reported on 03/13/2017) 5 tablet 0   Facility-Administered Medications Prior to Visit  Medication Dose Route Frequency Provider Last Rate Last Dose  . lidocaine (PF) (XYLOCAINE) 1 % injection 0.3 mL  0.3 mL Other Once Tyrell Antonio, MD      . methylPREDNISolone acetate (DEPO-MEDROL) injection 80 mg  80 mg Other Once Tyrell Antonio, MD         Allergies:   Alprazolam; Statins; Welchol [colesevelam hcl]; and Zocor [simvastatin]   Social History   Socioeconomic History  . Marital status: Single     Spouse name: None  . Number of children: None  . Years of education: None  . Highest education level: None  Social Needs  . Financial resource strain: None  . Food insecurity - worry: None  . Food insecurity - inability: None  . Transportation needs - medical: None  . Transportation needs - non-medical: None  Occupational History  . None  Tobacco Use  . Smoking status: Never Smoker  . Smokeless tobacco: Never Used  Substance and Sexual Activity  . Alcohol use: No    Alcohol/week: 0.0 oz  . Drug use: No  . Sexual activity: No    Birth control/protection: Post-menopausal  Other Topics Concern  . None  Social History Narrative  . None     Family History:  The patient's family history includes Hypertension in her mother; Pulmonary disease in her father.   ROS:   Please see the history of present illness.    Snoring, leg swelling, change in appetite, back pain, muscle pain, rash, dizziness. All other systems reviewed and are negative.   PHYSICAL EXAM:   VS:  BP (!) 152/84   Pulse 68   Ht 5\' 2"  (1.575 m)   Wt 166 lb 12.8 oz (75.7 kg)   BMI 30.51 kg/m    GEN: Well nourished, well developed, in no acute distress.  Obese. HEENT: normal  Neck: no JVD, carotid bruits, or masses Cardiac: RRR; no murmurs, rubs, or gallops,no edema  Respiratory:  clear to auscultation bilaterally, normal work of breathing GI: soft, nontender, nondistended, + BS MS: no deformity or atrophy  Skin: warm and dry, no rash Neuro:  Alert and Oriented x 3, Strength and sensation are intact Psych: euthymic mood, full affect  Wt Readings from Last 3 Encounters:  03/13/17 166 lb 12.8 oz (75.7 kg)  11/08/15 175 lb (79.4 kg)  10/06/15 175 lb (79.4 kg)      Studies/Labs Reviewed:   EKG:  EKG revealed left anterior hemiblock, poor R wave progression, sinus rhythm  Recent Labs: 03/06/2017: ALT 25; BUN 25; Creatinine, Ser 1.19; Hemoglobin 14.5; Platelets 303; Potassium 4.5; Sodium 137   Lipid  Panel No results found for: CHOL, TRIG, HDL, CHOLHDL, VLDL, LDLCALC, LDLDIRECT  Additional studies/ records that were reviewed today include:  CT angiogram of the chest 03/06/17: IMPRESSION: 1. Age indeterminate T4 compression fracture. Clinical correlation is recommended. 2. No CT evidence of pulmonary embolism or aortic dissection. 3. Mild cardiomegaly and coronary vascular calcification. 4. A 16 x 5 mm right lower lobe subpleural nodule or protrusion of chest wall fat tissue. Consider one of the following in 3 months for both low-risk and high-risk individuals: (a) repeat chest CT, (b) follow-up PET-CT, or (c) tissue sampling.     ASSESSMENT:    1. Syncope  and collapse   2. Chest discomfort   3. Coronary artery calcification seen on CAT scan   4. Essential hypertension      PLAN:  In order of problems listed above:  1. 30-day continuous monitor to exclude arrhythmia as a source of recent frequent falls. 2. Lexiscan myocardial perfusion study to rule out evidence of ischemia 3. Noted on CT scan done recently.  The CT scan at that time ruled out pulmonary emboli. 4. Target 130/80.  Story is somewhat difficult major issues are whether she is having arrhythmia causing syncope since she cannot remember the episodes of falling.  The chest discomfort could be related to ischemia given coronary calcification on CT.  Medication Adjustments/Labs and Tests Ordered: Current medicines are reviewed at length with the patient today.  Concerns regarding medicines are outlined above.  Medication changes, Labs and Tests ordered today are listed in the Patient Instructions below. There are no Patient Instructions on file for this visit.   Signed, Lesleigh Noe, MD  03/13/2017 3:33 PM    [facility] [address] [place], [place], [place]  [address] Phone: [phone]; Fax: [phone]

## 2017-03-13 ENCOUNTER — Ambulatory Visit: Payer: Medicare HMO | Admitting: Interventional Cardiology

## 2017-03-13 ENCOUNTER — Encounter: Payer: Self-pay | Admitting: Interventional Cardiology

## 2017-03-13 VITALS — BP 152/84 | HR 68 | Ht 62.0 in | Wt 166.8 lb

## 2017-03-13 DIAGNOSIS — R0789 Other chest pain: Secondary | ICD-10-CM

## 2017-03-13 DIAGNOSIS — R55 Syncope and collapse: Secondary | ICD-10-CM | POA: Diagnosis not present

## 2017-03-13 DIAGNOSIS — I10 Essential (primary) hypertension: Secondary | ICD-10-CM | POA: Diagnosis not present

## 2017-03-13 DIAGNOSIS — R0602 Shortness of breath: Secondary | ICD-10-CM | POA: Diagnosis not present

## 2017-03-13 DIAGNOSIS — I2510 Atherosclerotic heart disease of native coronary artery without angina pectoris: Secondary | ICD-10-CM

## 2017-03-13 NOTE — Patient Instructions (Signed)
Medication Instructions:  Your physician recommends that you continue on your current medications as directed. Please refer to the Current Medication list given to you today.  Labwork: None  Testing/Procedures: Your physician has recommended that you wear an event monitor. Event monitors are medical devices that record the heart's electrical activity. Doctors most often us these monitors to diagnose arrhythmias. Arrhythmias are problems with the speed or rhythm of the heartbeat. The monitor is a small, portable device. You can wear one while you do your normal daily activities. This is usually used to diagnose what is causing palpitations/syncope (passing out).  Your physician has requested that you have a lexiscan myoview. For further information please visit [email or web address]. Please follow instruction sheet, as given.   Follow-Up: Your physician recommends that you schedule a follow-up appointment as needed with Dr. Katrinka BlazingSmith.   Any Other Special Instructions Will Be Listed Below (If Applicable).     If you need a refill on your cardiac medications before your next appointment, please call your pharmacy.

## 2017-03-14 ENCOUNTER — Encounter: Payer: Self-pay | Admitting: Family Medicine

## 2017-03-19 ENCOUNTER — Telehealth (HOSPITAL_COMMUNITY): Payer: Self-pay | Admitting: *Deleted

## 2017-03-19 NOTE — Telephone Encounter (Signed)
Patient given detailed instructions per Myocardial Perfusion Study Information Sheet for the test on 03/22/17 at 1100. Patient notified to arrive 15 minutes early and that it is imperative to arrive on time for appointment to keep from having the test rescheduled.  If you need to cancel or reschedule your appointment, please call the office within 24 hours of your appointment. . Patient verbalized understanding.Brandyn Thien, Adelene IdlerCynthia W

## 2017-03-22 ENCOUNTER — Ambulatory Visit (HOSPITAL_COMMUNITY): Payer: Medicare HMO | Attending: Cardiovascular Disease

## 2017-03-22 ENCOUNTER — Ambulatory Visit (INDEPENDENT_AMBULATORY_CARE_PROVIDER_SITE_OTHER): Payer: Medicare HMO

## 2017-03-22 DIAGNOSIS — I10 Essential (primary) hypertension: Secondary | ICD-10-CM | POA: Diagnosis not present

## 2017-03-22 DIAGNOSIS — R0602 Shortness of breath: Secondary | ICD-10-CM

## 2017-03-22 DIAGNOSIS — R079 Chest pain, unspecified: Secondary | ICD-10-CM | POA: Diagnosis not present

## 2017-03-22 DIAGNOSIS — I2510 Atherosclerotic heart disease of native coronary artery without angina pectoris: Secondary | ICD-10-CM | POA: Diagnosis not present

## 2017-03-22 DIAGNOSIS — R55 Syncope and collapse: Secondary | ICD-10-CM

## 2017-03-22 DIAGNOSIS — R0789 Other chest pain: Secondary | ICD-10-CM

## 2017-03-22 LAB — MYOCARDIAL PERFUSION IMAGING
CHL CUP NUCLEAR SDS: 1
CHL CUP NUCLEAR SRS: 6
CHL CUP NUCLEAR SSS: 7
LHR: 0.38
LV dias vol: 53 mL (ref 46–106)
LV sys vol: 14 mL
Peak HR: 88 {beats}/min
Rest HR: 60 {beats}/min
TID: 0.91

## 2017-03-22 MED ORDER — TECHNETIUM TC 99M TETROFOSMIN IV KIT
10.0000 | PACK | Freq: Once | INTRAVENOUS | Status: AC | PRN
  Administered 2017-03-22: 10 via INTRAVENOUS
  Filled 2017-03-22: qty 10

## 2017-03-22 MED ORDER — TECHNETIUM TC 99M TETROFOSMIN IV KIT
32.2000 | PACK | Freq: Once | INTRAVENOUS | Status: AC | PRN
  Administered 2017-03-22: 32.2 via INTRAVENOUS
  Filled 2017-03-22: qty 33

## 2017-03-22 MED ORDER — REGADENOSON 0.4 MG/5ML IV SOLN
0.4000 mg | Freq: Once | INTRAVENOUS | Status: AC
  Administered 2017-03-22: 0.4 mg via INTRAVENOUS

## 2017-03-25 DIAGNOSIS — R55 Syncope and collapse: Secondary | ICD-10-CM | POA: Diagnosis not present

## 2017-03-28 DIAGNOSIS — R2681 Unsteadiness on feet: Secondary | ICD-10-CM | POA: Diagnosis not present

## 2017-03-28 DIAGNOSIS — R42 Dizziness and giddiness: Secondary | ICD-10-CM | POA: Diagnosis not present

## 2017-03-28 DIAGNOSIS — E038 Other specified hypothyroidism: Secondary | ICD-10-CM | POA: Diagnosis not present

## 2017-03-28 DIAGNOSIS — Z8541 Personal history of malignant neoplasm of cervix uteri: Secondary | ICD-10-CM | POA: Diagnosis not present

## 2017-03-28 DIAGNOSIS — E039 Hypothyroidism, unspecified: Secondary | ICD-10-CM | POA: Diagnosis not present

## 2017-03-28 DIAGNOSIS — R296 Repeated falls: Secondary | ICD-10-CM | POA: Diagnosis not present

## 2017-03-28 DIAGNOSIS — I10 Essential (primary) hypertension: Secondary | ICD-10-CM | POA: Diagnosis not present

## 2017-03-28 DIAGNOSIS — R918 Other nonspecific abnormal finding of lung field: Secondary | ICD-10-CM | POA: Diagnosis not present

## 2017-03-28 DIAGNOSIS — R9389 Abnormal findings on diagnostic imaging of other specified body structures: Secondary | ICD-10-CM | POA: Diagnosis not present

## 2017-03-28 DIAGNOSIS — E782 Mixed hyperlipidemia: Secondary | ICD-10-CM | POA: Diagnosis not present

## 2017-03-28 DIAGNOSIS — R2689 Other abnormalities of gait and mobility: Secondary | ICD-10-CM | POA: Diagnosis not present

## 2017-03-28 DIAGNOSIS — E063 Autoimmune thyroiditis: Secondary | ICD-10-CM | POA: Diagnosis not present

## 2017-04-04 ENCOUNTER — Institutional Professional Consult (permissible substitution): Payer: Medicare HMO | Admitting: Internal Medicine

## 2017-05-21 ENCOUNTER — Telehealth: Payer: Self-pay | Admitting: Interventional Cardiology

## 2017-05-21 NOTE — Telephone Encounter (Signed)
New Message   Patients daughter Selena Batten is calling to obtain information in reference to patients Cardiac Holter Monitor results. Please call to discuss.

## 2017-05-21 NOTE — Telephone Encounter (Signed)
I spoke with pt's daughter and reviewed monitor results with her.  

## 2017-05-21 NOTE — Telephone Encounter (Signed)
Left message to call back  

## 2019-03-10 IMAGING — CT CT ANGIO CHEST
3 of 7 series · 18 of 36 positions shown · IV contrast (ISOVUE)
Comparison: None.

CLINICAL DATA: 83-year-old female with chest trauma.

EXAM:
CT ANGIOGRAPHY CHEST WITH CONTRAST
TECHNIQUE: Multidetector CT imaging of the chest was performed using the
standard protocol during bolus administration of intravenous
contrast. Multiplanar CT image reconstructions and MIPs were
obtained to evaluate the vascular anatomy.
CONTRAST:  80mL [id number] IOPAMIDOL ([id number]-ZZA) INJECTION 76%

[Series 5: thins · axial · 0.65mm/px · z∈[-464,-234]mm · 14 of 265 slices shown]
[im 18/265  lung]
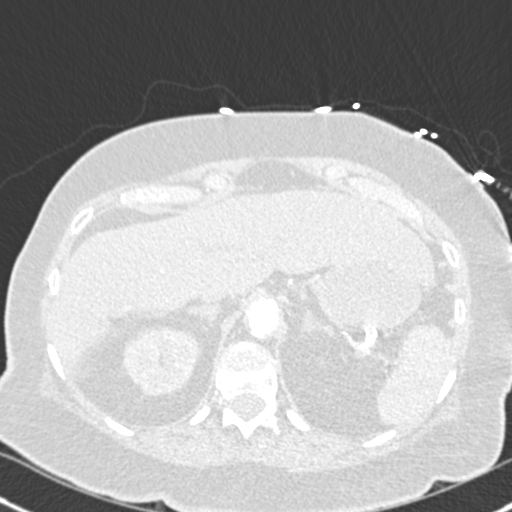
[im 36/265  mediastinal]
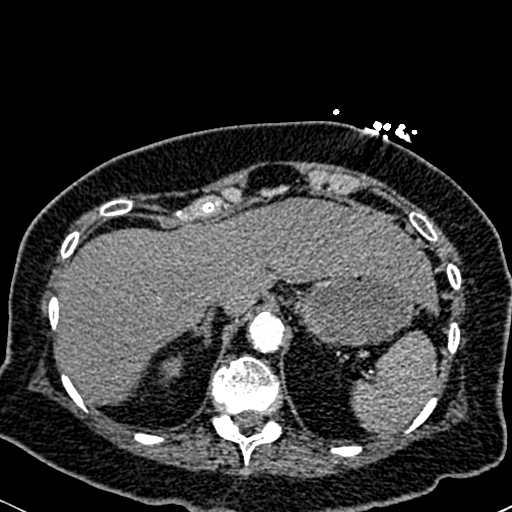
[im 53/265  lung]
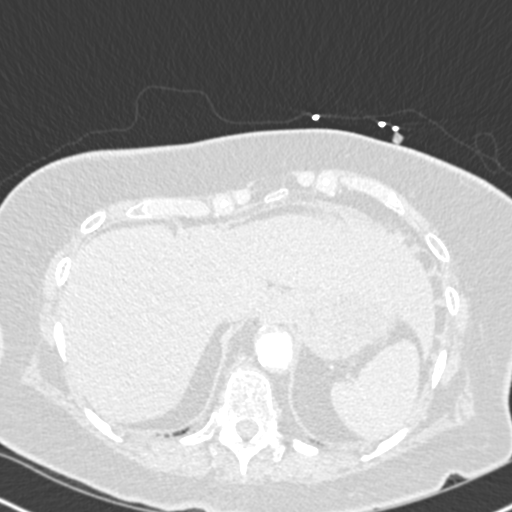
[im 71/265  mediastinal]
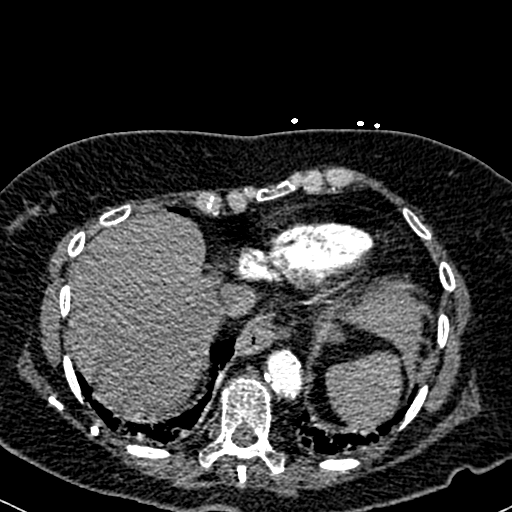
[im 89/265  lung]
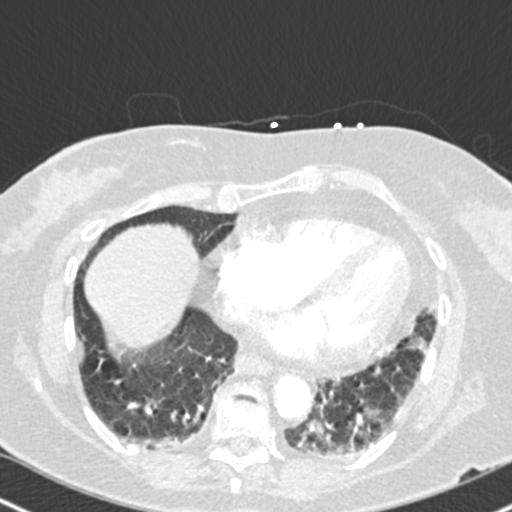
[im 106/265  mediastinal]
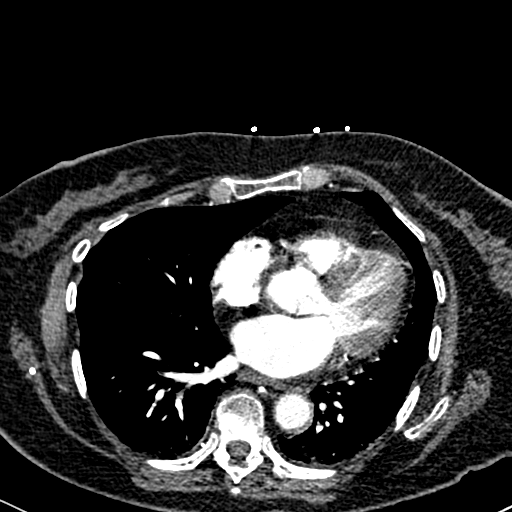
[im 124/265  lung]
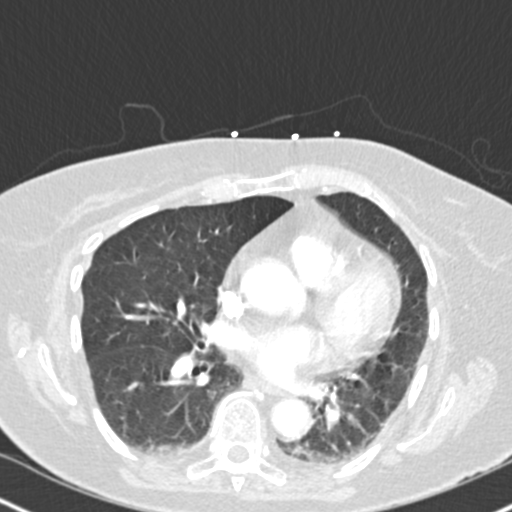
[im 141/265  mediastinal]
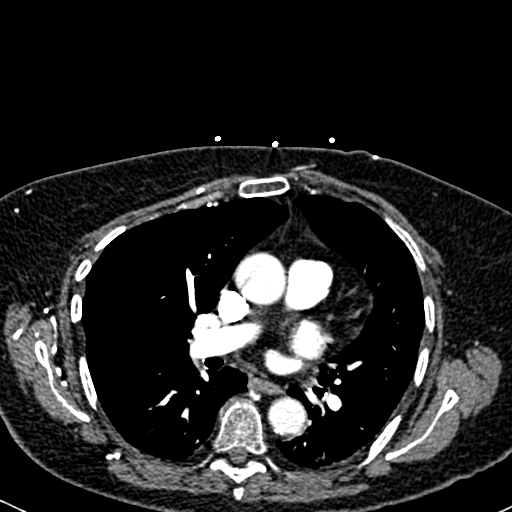
[im 159/265  lung]
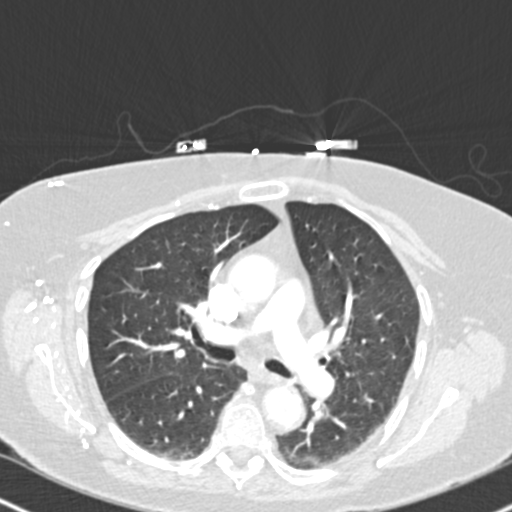
[im 177/265  mediastinal]
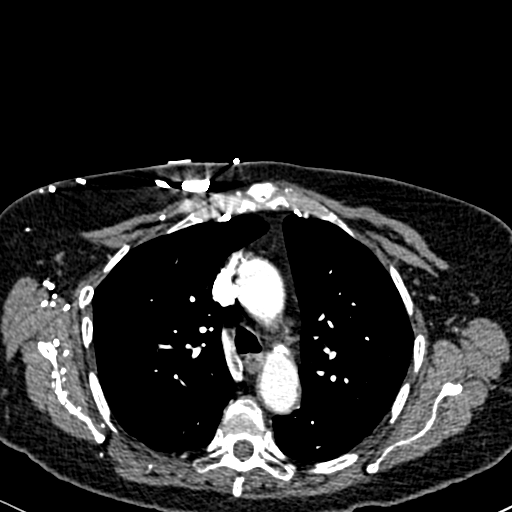
[im 194/265  lung]
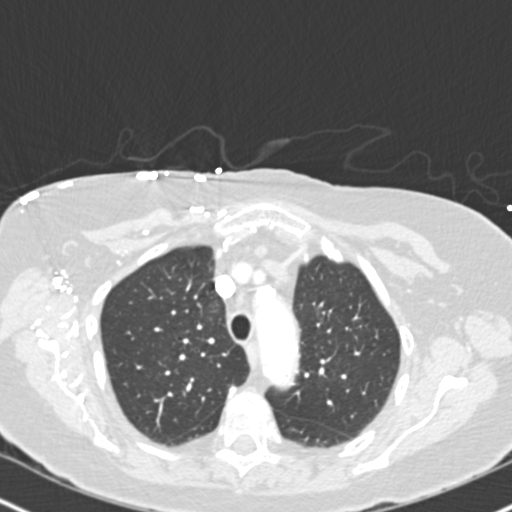
[im 212/265  mediastinal]
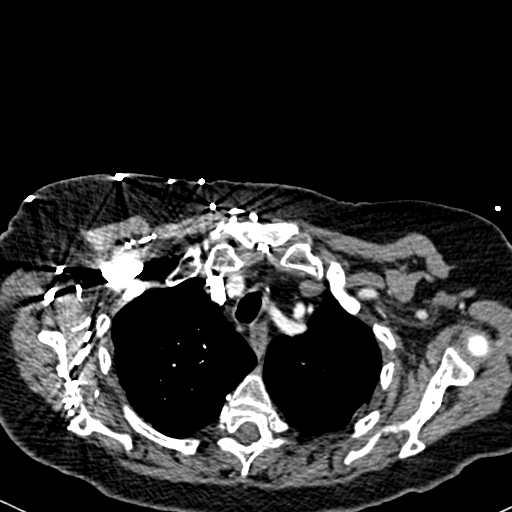
[im 229/265  lung]
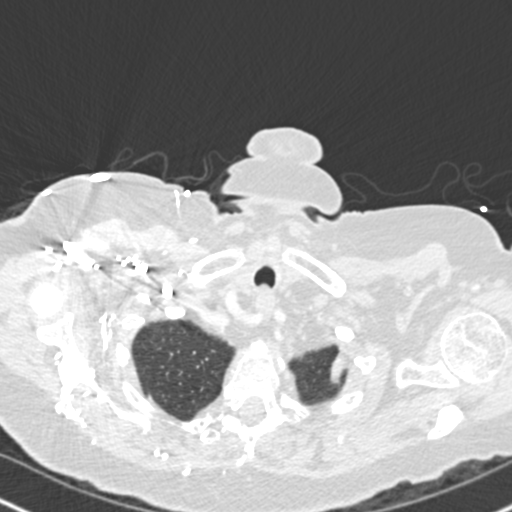
[im 247/265  mediastinal]
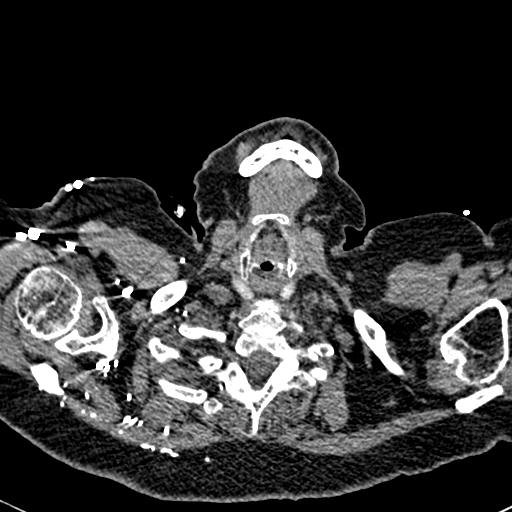

[Series 6: lung · axial · 0.65mm/px · z∈[-406,-334]mm · 3 of 109 slices shown]
[im 19/109  mediastinal]
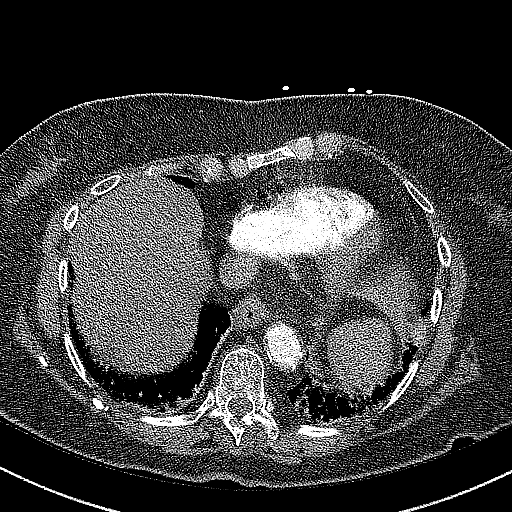
[im 37/109  mediastinal]
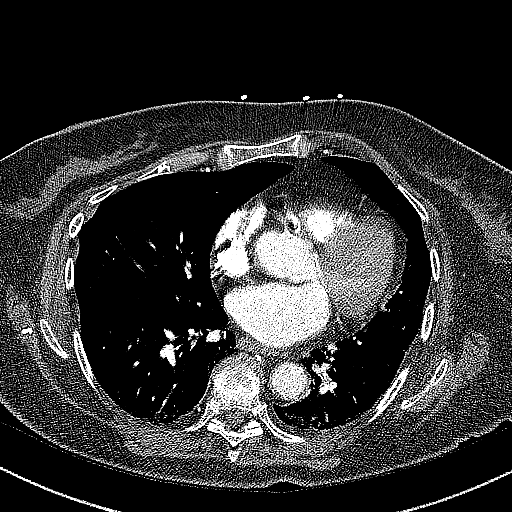
[im 55/109  mediastinal]
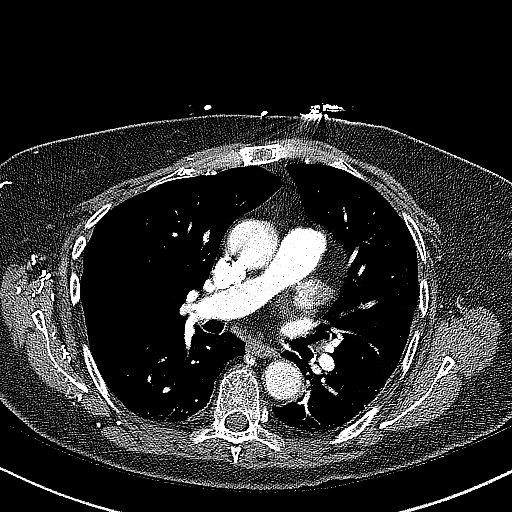

[Series 7: coronal mpr · coronal · 0.50mm/px · 1 of 139 slices shown]
[im 70/139  mediastinal]
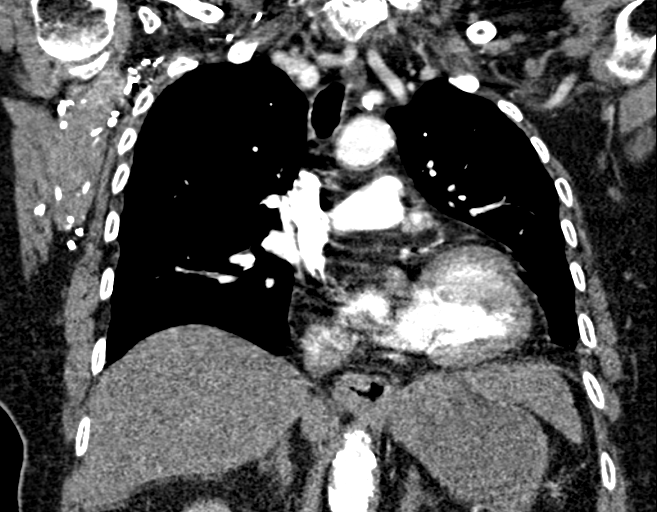

[18 of 36 positions shown; findings below may reference images not displayed]

FINDINGS: Cardiovascular: Mild cardiomegaly. Coronary vascular calcification.
There is no pericardial effusion. Advanced atherosclerotic
calcification of the thoracic aorta. No aneurysmal dilatation or
evidence of dissection. The origins of the great vessels of the
aortic arch are patent. There is no CT evidence of pulmonary
embolism.

Mediastinum/Nodes: No hilar or mediastinal adenopathy. Small hiatal
hernia. The esophagus is grossly unremarkable. No mediastinal fluid
collection.

Lungs/Pleura: There are minimal bibasilar atelectasis/scarring as
well as interstitial coarsening. There is a 16 x 5 mm subpleural
nodular density along the lateral pleural surface of the right lower
lobe (series 6, image 85). This appears to demonstrate fat
attenuation and likely represents protrusion of intracostal fat.
Attention to this area on follow-up imaging recommended. There is no
focal consolidation, pleural effusion, or pneumothorax. The central
airways are patent.

Upper Abdomen: Probable mild fatty liver. The visualized upper
abdomen is otherwise unremarkable.

Musculoskeletal: There is osteopenia with multilevel degenerative
changes of the spine. T4 compression fracture with approximately 50%
loss of vertebral body height centrally and anterior wedging age
indeterminate. Correlation with clinical exam and point tenderness
recommended. No retropulsed fragment. No other acute osseous
pathology.

Review of the MIP images confirms the above findings.
IMPRESSION: 1. Age indeterminate T4 compression fracture. Clinical correlation
is recommended.
2. No CT evidence of pulmonary embolism or aortic dissection.
3. Mild cardiomegaly and coronary vascular calcification.
4. A 16 x 5 mm right lower lobe subpleural nodule or protrusion of
chest wall fat tissue. Consider one of the following in 3 months for
both low-risk and high-risk individuals: (a) repeat chest CT, (b)
follow-up PET-CT, or (c) tissue sampling. This recommendation
follows the consensus statement: Guidelines for Management of
Incidental Pulmonary Nodules Detected on CT Images: From the
